# Patient Record
Sex: Female | Born: 1985 | Race: Asian | Hispanic: No | Marital: Single | Smoking: Never smoker
Health system: Southern US, Community
[De-identification: ages and names within clinical notes are randomized; demographics above are authoritative.]

## PROBLEM LIST (undated history)

## (undated) ENCOUNTER — Inpatient Hospital Stay (HOSPITAL_COMMUNITY): Payer: Self-pay

## (undated) DIAGNOSIS — O24419 Gestational diabetes mellitus in pregnancy, unspecified control: Secondary | ICD-10-CM

## (undated) DIAGNOSIS — D649 Anemia, unspecified: Secondary | ICD-10-CM

## (undated) HISTORY — DX: Gestational diabetes mellitus in pregnancy, unspecified control: O24.419

## (undated) HISTORY — PX: NO PAST SURGERIES: SHX2092

---

## 2004-09-15 ENCOUNTER — Other Ambulatory Visit: Admission: RE | Admit: 2004-09-15 | Discharge: 2004-09-15 | Payer: Self-pay | Admitting: Obstetrics and Gynecology

## 2004-10-05 ENCOUNTER — Inpatient Hospital Stay (HOSPITAL_COMMUNITY): Admission: AD | Admit: 2004-10-05 | Discharge: 2004-10-05 | Payer: Self-pay | Admitting: Obstetrics and Gynecology

## 2004-12-05 ENCOUNTER — Ambulatory Visit (HOSPITAL_COMMUNITY): Admission: RE | Admit: 2004-12-05 | Discharge: 2004-12-05 | Payer: Self-pay | Admitting: Obstetrics and Gynecology

## 2005-03-16 ENCOUNTER — Inpatient Hospital Stay (HOSPITAL_COMMUNITY): Admission: AD | Admit: 2005-03-16 | Discharge: 2005-03-18 | Payer: Self-pay | Admitting: Obstetrics and Gynecology

## 2005-04-27 ENCOUNTER — Other Ambulatory Visit: Admission: RE | Admit: 2005-04-27 | Discharge: 2005-04-27 | Payer: Self-pay | Admitting: Obstetrics and Gynecology

## 2006-07-22 ENCOUNTER — Other Ambulatory Visit: Admission: RE | Admit: 2006-07-22 | Discharge: 2006-07-22 | Payer: Self-pay | Admitting: Obstetrics and Gynecology

## 2008-10-11 ENCOUNTER — Inpatient Hospital Stay (HOSPITAL_COMMUNITY): Admission: AD | Admit: 2008-10-11 | Discharge: 2008-10-11 | Payer: Self-pay | Admitting: Obstetrics & Gynecology

## 2008-11-21 ENCOUNTER — Ambulatory Visit: Payer: Self-pay | Admitting: Obstetrics and Gynecology

## 2008-11-21 ENCOUNTER — Encounter: Payer: Self-pay | Admitting: Obstetrics and Gynecology

## 2008-11-21 LAB — CONVERTED CEMR LAB: hCG, Beta Chain, Quant, S: <2 milliintl units/mL

## 2010-10-08 LAB — GC/CHLAMYDIA PROBE AMP, GENITAL
Chlamydia, DNA Probe: NEGATIVE
GC Probe Amp, Genital: NEGATIVE

## 2010-10-08 LAB — DIFFERENTIAL
Basophils Absolute: 0 10*3/uL (ref 0.0–0.1)
Eosinophils Relative: 4 % (ref 0–5)
Neutrophils Relative %: 48 % (ref 43–77)

## 2010-10-08 LAB — CBC
HCT: 31 % — ABNORMAL LOW (ref 36.0–46.0)
Hemoglobin: 10.6 g/dL — ABNORMAL LOW (ref 12.0–15.0)
MCHC: 34.1 g/dL (ref 30.0–36.0)
MCV: 78.1 fL (ref 78.0–100.0)
Platelets: 286 10*3/uL (ref 150–400)
RBC: 3.97 MIL/uL (ref 3.87–5.11)

## 2010-10-08 LAB — WET PREP, GENITAL

## 2010-11-11 NOTE — Group Therapy Note (Signed)
Sierra Gamble, Sierra Gamble                  ACCOUNT NO.:  [id number]   MEDICAL RECORD NO.:  [id number]          PATIENT TYPE:  WOC   LOCATION:  [facility]                   FACILITY:  [facility]   PHYSICIAN:  Argentina Donovan, MD        DATE OF BIRTH:  Aug 31, 1985   DATE OF SERVICE:  11/21/2008                                  CLINIC NOTE   The patient is a 25 year old gravida 2, para 1-0-1-1 who was seen in the  maternity admissions with a failed pregnancy and missed abortion on  October 11, 2008, was given 800 of Cytotec which she took 2 days later.  Bleeding increased, lasted for about 2 weeks and then has stopped.  She  has had none since then.  Came in.  Has not had a Pap smear since 2007,  had an abnormal one in 2006 but apparently followup was normal.  The  patient will have a quantitative beta done today and if it is not back  to normal then we will give her a followup call.  She has decided that  she would like to start on birth control pills.  We are going to order a  prescription for Sprintec since she has no insurance at this point.   EXAMINATION:  Her abdomen is soft, flat, nontender.  No masses or  organomegaly.  External genitalia is normal.  BUS within normal limits.  Vagina is  clean and well rugated.  The cervix is clean with a marked ectropion.  Pap smear was taken.  The uterus is anterior, of normal size, shape,  consistency.  The adnexa is normal.   IMPRESSION:  Normal gynecological examination with a mild ectropion.   PLAN:  1. Contraceptives initiation with Sprintec.  2. Followup depending on quantitative beta hCG.           ______________________________  Argentina Donovan, MD     PR/MEDQ  D:  11/21/2008  T:  11/21/2008  Job:  [id number]

## 2010-11-14 NOTE — Discharge Summary (Signed)
Sierra Gamble, Sierra Gamble                  ACCOUNT NO.:  [id number]   MEDICAL RECORD NO.:  [id number]          PATIENT TYPE:  INP   LOCATION:  9135                          [facility]   PHYSICIAN:  James A. Ashley Royalty, M.D.DATE OF BIRTH:  1985/07/07   DATE OF ADMISSION:  03/16/2005  DATE OF DISCHARGE:  03/18/2005                                 DISCHARGE SUMMARY   DISCHARGE DIAGNOSES:  1.  Intrauterine pregnancy at 37-1/2 weeks' gestation, delivered.  2.  Term birth, living child, vertex.   __________ delivery.   CONSULTATIONS:  None.   DISCHARGE MEDICATIONS:  Tylenol.   HISTORY AND PHYSICAL:  This is a 25 year old gravida 2, para 0, EDC April 03, 2005, 37-1/2 weeks' gestation.  Prenatal care was essentially  uncomplicated.  The patient presented to the office on the day of admission  complaining of cramping and spotting.  Examination revealed her to be  completely dilated at 0 station.  She was transferred immediately to labor  and delivery.  For the remainder of the history and physical, please see  chart.   HOSPITAL COURSE:  The patient was admitted to [facility].  Admission laboratory studies were drawn.  The patient shortly  after delivered in the labor and delivery area at [facility].  She had  a spontaneous vaginal delivery just as I was called to come for delivery.  After delivery I inspected the patient's perineum and found it to be without  laceration.  There were no complications.  The infant was a 5 pound 5 ounce  female, Apgars 9 at one minute, 9 at five minutes, sent to the newborn  nursery.  The patient's postpartum course was benign.  She was discharged on  the second postpartum day, afebrile, in a satisfactory condition.   DISPOSITION:  The patient is to return to [facility] in four to six weeks for postpartum evaluation.           ______________________________  Rudy Jew Ashley Royalty, M.D.     JAM/MEDQ  D:   04/21/2005  T:  04/21/2005  Job:  [id number]

## 2013-10-28 ENCOUNTER — Ambulatory Visit: Payer: Self-pay | Admitting: Internal Medicine

## 2013-10-28 VITALS — BP 100/76 | HR 89 | Temp 98.7°F | Resp 14 | Ht 60.0 in | Wt 104.0 lb

## 2013-10-28 DIAGNOSIS — R059 Cough, unspecified: Secondary | ICD-10-CM

## 2013-10-28 DIAGNOSIS — J40 Bronchitis, not specified as acute or chronic: Secondary | ICD-10-CM

## 2013-10-28 DIAGNOSIS — J029 Acute pharyngitis, unspecified: Secondary | ICD-10-CM

## 2013-10-28 DIAGNOSIS — R05 Cough: Secondary | ICD-10-CM

## 2013-10-28 MED ORDER — HYDROCODONE-ACETAMINOPHEN 7.5-325 MG/15ML PO SOLN: 5.0000 mL | Freq: Four times a day (QID) | ORAL | Status: DC | PRN

## 2013-10-28 MED ORDER — AZITHROMYCIN 500 MG PO TABS: 500.0000 mg | ORAL_TABLET | Freq: Every day | ORAL | Status: DC

## 2013-10-28 NOTE — Patient Instructions (Signed)

## 2013-10-28 NOTE — Progress Notes (Signed)
   Subjective:    Patient ID: Sierra Gamble, female    DOB: 12-02-1985, 28 y.o.   MRN: [id number]  HPI 28 year old female pt presents with sinus congestion, chest congestion, fever, and cough. The sinus congestion started Sunday. Wednesday morning she was running a fever. Chest congestion started Friday. The fever ran between Wednesday and Thursday. Her fever ranged between 101.0-103.0. She also has a sore throat and headache. No SOB or chest pain. She has not been blowing anything out of her nose. Her cough is non-productive. She started taking advil, but now it just using tylenol. Her mom has been sick with similar symptoms.       Review of Systems     Objective:   Physical Exam  Constitutional: She is oriented to person, place, and time. She appears well-developed and well-nourished.  HENT:  Head: Normocephalic.  Right Ear: External ear normal.  Left Ear: External ear normal.  Nose: Mucosal edema, rhinorrhea and sinus tenderness present. Right sinus exhibits maxillary sinus tenderness. Right sinus exhibits no frontal sinus tenderness. Left sinus exhibits maxillary sinus tenderness. Left sinus exhibits no frontal sinus tenderness.  Mouth/Throat: Oropharynx is clear and moist.  Eyes: EOM are normal. Pupils are equal, round, and reactive to light.  Neck: Normal range of motion. Neck supple.  Cardiovascular: Normal rate.   Pulmonary/Chest: Effort normal. No respiratory distress. She has no wheezes. She has rhonchi. She has no rales. She exhibits no tenderness.  Musculoskeletal: Normal range of motion.  Neurological: She is alert and oriented to person, place, and time. She exhibits normal muscle tone. Coordination normal.  Psychiatric: She has a normal mood and affect. Her behavior is normal. Judgment and thought content normal.          Assessment & Plan:  Bronchitis/Sinusitis/Cough Zithromax 500mg Alvina Filbert elixir

## 2014-10-26 ENCOUNTER — Emergency Department (HOSPITAL_COMMUNITY)
Admission: EM | Admit: 2014-10-26 | Discharge: 2014-10-26 | Disposition: A | Discharge status: Home / Self Care | Payer: BLUE CROSS/BLUE SHIELD | Source: Home / Self Care | Attending: Family Medicine | Admitting: Family Medicine

## 2014-10-26 ENCOUNTER — Emergency Department (INDEPENDENT_AMBULATORY_CARE_PROVIDER_SITE_OTHER): Payer: BLUE CROSS/BLUE SHIELD

## 2014-10-26 ENCOUNTER — Encounter (HOSPITAL_COMMUNITY): Payer: Self-pay | Admitting: Emergency Medicine

## 2014-10-26 DIAGNOSIS — R0789 Other chest pain: Secondary | ICD-10-CM

## 2014-10-26 NOTE — ED Notes (Signed)
Patient c/o right side chest pain x 3 days. Patient reports that this is an old problem but it only recently became painful enough for her to seek medical attention. Patient denies cardiac problems. Patient reports chest feels achy and tight. Patient denies SOB. In NAD.

## 2014-10-26 NOTE — Discharge Instructions (Signed)
If your chest pain worsens or you develop shortness of breath, sweating, nausea, vomiting, dizziness, or any other symptoms that are concerning to you, call 911 or have someone take the emergency department. Otherwise follow-up with the cardiologist listed above, call their office Monday morning to arrange an appointment to be evaluated.  Chest Pain (Nonspecific) It is often hard to give a specific diagnosis for the cause of chest pain. There is always a chance that your pain could be related to something serious, such as a heart attack or a blood clot in the lungs. You need to follow up with your health care provider for further evaluation. CAUSES   Heartburn.  Pneumonia or bronchitis.  Anxiety or stress.  Inflammation around your heart (pericarditis) or lung (pleuritis or pleurisy).  A blood clot in the lung.  A collapsed lung (pneumothorax). It can develop suddenly on its own (spontaneous pneumothorax) or from trauma to the chest.  Shingles infection (herpes zoster virus). The chest wall is composed of bones, muscles, and cartilage. Any of these can be the source of the pain.  The bones can be bruised by injury.  The muscles or cartilage can be strained by coughing or overwork.  The cartilage can be affected by inflammation and become sore (costochondritis). DIAGNOSIS  Lab tests or other studies may be needed to find the cause of your pain. Your health care provider may have you take a test called an ambulatory electrocardiogram (ECG). An ECG records your heartbeat patterns over a 24-hour period. You may also have other tests, such as:  Transthoracic echocardiogram (TTE). During echocardiography, sound waves are used to evaluate how blood flows through your heart.  Transesophageal echocardiogram (TEE).  Cardiac monitoring. This allows your health care provider to monitor your heart rate and rhythm in real time.  Holter monitor. This is a portable device that records your  heartbeat and can help diagnose heart arrhythmias. It allows your health care provider to track your heart activity for several days, if needed.  Stress tests by exercise or by giving medicine that makes the heart beat faster. TREATMENT   Treatment depends on what may be causing your chest pain. Treatment may include:  Acid blockers for heartburn.  Anti-inflammatory medicine.  Pain medicine for inflammatory conditions.  Antibiotics if an infection is present.  You may be advised to change lifestyle habits. This includes stopping smoking and avoiding alcohol, caffeine, and chocolate.  You may be advised to keep your head raised (elevated) when sleeping. This reduces the chance of acid going backward from your stomach into your esophagus. Most of the time, nonspecific chest pain will improve within 2-3 days with rest and mild pain medicine.  HOME CARE INSTRUCTIONS   If antibiotics were prescribed, take them as directed. Finish them even if you start to feel better.  For the next few days, avoid physical activities that bring on chest pain. Continue physical activities as directed.  Do not use any tobacco products, including cigarettes, chewing tobacco, or electronic cigarettes.  Avoid drinking alcohol.  Only take medicine as directed by your health care provider.  Follow your health care provider's suggestions for further testing if your chest pain does not go away.  Keep any follow-up appointments you made. If you do not go to an appointment, you could develop lasting (chronic) problems with pain. If there is any problem keeping an appointment, call to reschedule. SEEK MEDICAL CARE IF:   Your chest pain does not go away, even after treatment.  You have a rash with blisters on your chest.  You have a fever. SEEK IMMEDIATE MEDICAL CARE IF:   You have increased chest pain or pain that spreads to your arm, neck, jaw, back, or abdomen.  You have shortness of breath.  You have  an increasing cough, or you cough up blood.  You have severe back or abdominal pain.  You feel nauseous or vomit.  You have severe weakness.  You faint.  You have chills. This is an emergency. Do not wait to see if the pain will go away. Get medical help at once. Call your local emergency services (911 in [place] Do not drive yourself to the hospital. MAKE SURE YOU:   Understand these instructions.  Will watch your condition.  Will get help right away if you are not doing well or get worse. Document Released: 03/25/2005 Document Revised: 06/20/2013 Document Reviewed: 01/19/2008 [facility] Patient Information 2015 [place], [place]. This information is not intended to replace advice given to you by your health care provider. Make sure you discuss any questions you have with your health care provider.

## 2014-10-26 NOTE — ED Provider Notes (Signed)
CSN: [id number]     Arrival date & time 10/26/14  1652 History   First MD Initiated Contact with Patient 10/26/14 1747     Chief Complaint  Patient presents with  . Chest Pain   (Consider location/radiation/quality/duration/timing/severity/associated sxs/prior Treatment) HPI      29 year old female presents complaining of chest pain. She has had this on and off for many years. She will have recurring right-sided chest pain that feels like squeezing. There are no associated symptoms including no shortness of breath, diaphoresis, NVD, dizziness, weakness. Pain is nonexertional and is not alleviated or exacerbated by anything. She denies any trauma. She denies any coughing or respiratory symptoms. She denies any past medical history. She does not take hormonal birth control and has no recent travel. She has never left the country. She has never seen anyone about this problem in the past. She has no relevant family history. She denies any pain in the breast  History reviewed. No pertinent past medical history. History reviewed. No pertinent past surgical history. Family History  Problem Relation Age of Onset  . Thyroid disease Mother   . Diabetes Maternal Grandmother   . Thyroid disease Maternal Grandmother    History  Substance Use Topics  . Smoking status: Never Smoker   . Smokeless tobacco: Never Used  . Alcohol Use: No   OB History    No data available     Review of Systems  Cardiovascular: Positive for chest pain.  All other systems reviewed and are negative.   Allergies  Review of patient's allergies indicates no known allergies.  Home Medications   Prior to Admission medications   Medication Sig Start Date End Date Taking? Authorizing Provider  azithromycin (ZITHROMAX) 500 MG tablet Take 1 tablet (500 mg total) by mouth daily. 10/28/13   Orma Flaming, MD  HYDROcodone-acetaminophen (HYCET) 7.5-325 mg/15 ml solution Take 5 mLs by mouth every 6 (six) hours as needed (or  cough). 10/28/13   Orma Flaming, MD   BP 120/81 mmHg  Pulse 69  Temp(Src) 98 F (36.7 C) (Oral)  Resp 18  SpO2 100%  LMP 10/26/2014 Physical Exam  Constitutional: She is oriented to person, place, and time. Vital signs are normal. She appears well-developed and well-nourished. No distress.  HENT:  Head: Normocephalic and atraumatic.  Right Ear: External ear normal.  Left Ear: External ear normal.  Nose: Nose normal.  Mouth/Throat: Oropharynx is clear and moist. No oropharyngeal exudate.  Eyes: Conjunctivae are normal.  Neck: Neck supple. No JVD present. No tracheal deviation present.  Cardiovascular: Normal rate, regular rhythm, normal heart sounds and intact distal pulses.  Exam reveals no gallop and no friction rub.   No murmur heard. Pulmonary/Chest: Effort normal and breath sounds normal. No respiratory distress. She has no wheezes. She has no rales. She exhibits no tenderness.  Abdominal: Soft. Bowel sounds are normal. She exhibits no distension and no mass. There is no tenderness. There is no rebound and no guarding.  Neurological: She is alert and oriented to person, place, and time. She has normal strength. Coordination normal.  Skin: Skin is warm and dry. No rash noted. She is not diaphoretic.  Psychiatric: She has a normal mood and affect. Judgment normal.  Nursing note and vitals reviewed.   ED Course  ED EKG  Date/Time: 10/26/2014 7:22 PM Performed by: Liam Graham Authorized by: Allena Katz H Comparison: not compared with previous ECG  Rhythm: sinus rhythm Rate: normal QRS axis: normal Conduction: conduction  normal ST Segments: ST segments normal T Waves: T waves normal T depression: V2 Other: no other findings Clinical impression: normal ECG Comments: EKG independently reviewed by me as above   (including critical care time) Labs Review Labs Reviewed - No data to display  Imaging Review Dg Chest 2 View  10/26/2014   CLINICAL DATA:  Chest pain  and tightness for several days.  EXAM: CHEST  2 VIEW  COMPARISON:  None.  FINDINGS: The heart size and mediastinal contours are within normal limits. Both lungs are clear. No evidence of pleural effusion. No mass or lymphadenopathy identified. The visualized skeletal structures are unremarkable.  IMPRESSION: Negative.  No active cardiopulmonary disease.   Electronically Signed   By: Earle Gell M.D.   On: 10/26/2014 19:16     MDM   1. Atypical chest pain    No red flags. Her exam is normal and her chest pain is atypical for any cardiac related chest pain or ischemic chest pain. Chest x-ray and EKG are normal. Will discharge with follow-up with cardiology next week. ED if worsening       Liam Graham, PA-C 10/26/14 1924

## 2015-05-16 ENCOUNTER — Other Ambulatory Visit: Payer: Self-pay | Admitting: Nurse Practitioner

## 2015-05-16 DIAGNOSIS — N6311 Unspecified lump in the right breast, upper outer quadrant: Secondary | ICD-10-CM

## 2015-05-20 ENCOUNTER — Ambulatory Visit
Admission: RE | Admit: 2015-05-20 | Discharge: 2015-05-20 | Disposition: A | Discharge status: Home / Self Care | Payer: BLUE CROSS/BLUE SHIELD | Source: Ambulatory Visit | Attending: Nurse Practitioner | Admitting: Nurse Practitioner

## 2015-05-20 DIAGNOSIS — N6311 Unspecified lump in the right breast, upper outer quadrant: Secondary | ICD-10-CM

## 2017-06-12 ENCOUNTER — Encounter (HOSPITAL_COMMUNITY): Payer: Self-pay | Admitting: *Deleted

## 2017-06-12 ENCOUNTER — Inpatient Hospital Stay (HOSPITAL_COMMUNITY)
Admission: AD | Admit: 2017-06-12 | Discharge: 2017-06-12 | Disposition: A | Discharge status: Home / Self Care | Payer: Medicaid Other | Source: Ambulatory Visit | Attending: Obstetrics and Gynecology | Admitting: Obstetrics and Gynecology

## 2017-06-12 ENCOUNTER — Inpatient Hospital Stay (HOSPITAL_COMMUNITY): Payer: Medicaid Other

## 2017-06-12 DIAGNOSIS — Z3A12 12 weeks gestation of pregnancy: Secondary | ICD-10-CM | POA: Insufficient documentation

## 2017-06-12 DIAGNOSIS — O209 Hemorrhage in early pregnancy, unspecified: Secondary | ICD-10-CM

## 2017-06-12 LAB — URINALYSIS, ROUTINE W REFLEX MICROSCOPIC
Bilirubin Urine: NEGATIVE
GLUCOSE, UA: NEGATIVE mg/dL
Ketones, ur: NEGATIVE mg/dL
Leukocytes, UA: NEGATIVE
Nitrite: NEGATIVE
PH: 7 (ref 5.0–8.0)
Protein, ur: NEGATIVE mg/dL
RBC / HPF: NONE SEEN RBC/hpf (ref 0–5)
SPECIFIC GRAVITY, URINE: 1.005 (ref 1.005–1.030)

## 2017-06-12 LAB — POCT PREGNANCY, URINE: Preg Test, Ur: POSITIVE — AB

## 2017-06-12 NOTE — MAU Provider Note (Signed)
History   G2P1001 @ 12 wks by LMP in with sm amt vag bleeding after having sex last night.   CSN: [id number]  Arrival date & time 06/12/17  1841   None     Chief Complaint  Patient presents with  . Possible Pregnancy  . Vaginal Bleeding    HPI  No past medical history on file.  No past surgical history on file.  Family History  Problem Relation Age of Onset  . Thyroid disease Mother   . Diabetes Maternal Grandmother   . Thyroid disease Maternal Grandmother     Social History   Tobacco Use  . Smoking status: Never Smoker  . Smokeless tobacco: Never Used  Substance Use Topics  . Alcohol use: No  . Drug use: No    OB History    Gravida Para Term Preterm AB Living   2 1 1     1    SAB TAB Ectopic Multiple Live Births           1      Review of Systems  Constitutional: Negative.   HENT: Negative.   Eyes: Negative.   Respiratory: Negative.   Cardiovascular: Negative.   Gastrointestinal: Negative.   Endocrine: Negative.   Genitourinary: Positive for vaginal bleeding.  Musculoskeletal: Negative.   Skin: Negative.   Allergic/Immunologic: Negative.   Neurological: Negative.   Hematological: Negative.   Psychiatric/Behavioral: Negative.     Allergies  Patient has no known allergies.  Home Medications    BP 114/76 (BP Location: Right Arm)   Pulse 61   Temp 98.6 F (37 C) (Oral)   Resp 16   Ht 5' (1.524 m)   Wt 119 lb (54 kg)   LMP 03/13/2017   SpO2 100%   BMI 23.24 kg/m   Physical Exam  Constitutional: She is oriented to person, place, and time. She appears well-developed and well-nourished.  HENT:  Head: Normocephalic.  Eyes: Pupils are equal, round, and reactive to light.  Neck: Normal range of motion.  Cardiovascular: Normal rate, regular rhythm, normal heart sounds and intact distal pulses.  Pulmonary/Chest: Effort normal and breath sounds normal.  Abdominal: Soft. Bowel sounds are normal.  Genitourinary: Vagina normal and uterus  normal.  Genitourinary Comments: Scant amt pinkish vag bleeding  Musculoskeletal: Normal range of motion.  Neurological: She is alert and oriented to person, place, and time. She has normal reflexes.  Skin: Skin is warm and dry.  Psychiatric: She has a normal mood and affect. Her behavior is normal. Judgment and thought content normal.    MAU Course  Procedures (including critical care time)  Labs Reviewed  URINALYSIS, ROUTINE W REFLEX MICROSCOPIC - Abnormal; Notable for the following components:      Result Value   APPearance HAZY (*)    Hgb urine dipstick SMALL (*)    Bacteria, UA RARE (*)    Squamous Epithelial / LPF 0-5 (*)    All other components within normal limits  POCT PREGNANCY, URINE - Abnormal; Notable for the following components:   Preg Test, Ur POSITIVE (*)    All other components within normal limits   No results found.   No diagnosis found.    MDM  VSS, scant amt vag bleeding noted. U/s shows viable preg at 6.5 wks will d/c pt home on pelvic rest

## 2017-06-12 NOTE — Discharge Instructions (Signed)
Vaginal Bleeding During Pregnancy, First Trimester A small amount of bleeding (spotting) from the vagina is common in early pregnancy. Sometimes the bleeding is normal and is not a problem, and sometimes it is a sign of something serious. Be sure to tell your doctor about any bleeding from your vagina right away. Follow these instructions at home:  Watch your condition for any changes.  Follow your doctor's instructions about how active you can be.  If you are on bed rest: ? You may need to stay in bed and only get up to use the bathroom. ? You may be allowed to do some activities. ? If you need help, make plans for someone to help you.  Write down: ? The number of pads you use each day. ? How often you change pads. ? How soaked (saturated) your pads are.  Do not use tampons.  Do not douche.  Do not have sex or orgasms until your doctor says it is okay.  If you pass any tissue from your vagina, save the tissue so you can show it to your doctor.  Only take medicines as told by your doctor.  Do not take aspirin because it can make you bleed.  Keep all follow-up visits as told by your doctor. Contact a doctor if:  You bleed from your vagina.  You have cramps.  You have labor pains.  You have a fever that does not go away after you take medicine. Get help right away if:  You have very bad cramps in your back or belly (abdomen).  You pass large clots or tissue from your vagina.  You bleed more.  You feel light-headed or weak.  You pass out (faint).  You have chills.  You are leaking fluid or have a gush of fluid from your vagina.  You pass out while pooping (having a bowel movement). This information is not intended to replace advice given to you by your health care provider. Make sure you discuss any questions you have with your health care provider. Document Released: 10/30/2013 Document Revised: 11/21/2015 Document Reviewed: 02/20/2013 Elsevier Interactive  Patient Education  2018 [place].  Pelvic Rest Pelvic rest may be recommended if:  Your placenta is partially or completely covering the opening of your cervix (placenta previa).  There is bleeding between the wall of the uterus and the amniotic sac in the first trimester of pregnancy (subchorionic hemorrhage).  You went into labor too early (preterm labor).  Based on your overall health and the health of your baby, your health care provider will decide if pelvic rest is right for you. How do I rest my pelvis? For as long as told by your health care provider:  Do not have sex, sexual stimulation, or an orgasm.  Do not use tampons. Do not douche. Do not put anything in your vagina.  Do not lift anything that is heavier than 10 lb (4.5 kg).  Avoid activities that take a lot of effort (are strenuous).  Avoid any activity in which your pelvic muscles could become strained.  When should I seek medical care? Seek medical care if you have:  Cramping pain in your lower abdomen.  Vaginal discharge.  A low, dull backache.  Regular contractions.  Uterine tightening.  When should I seek immediate medical care? Seek immediate medical care if:  You have vaginal bleeding and you are pregnant.  This information is not intended to replace advice given to you by your health care provider. Make sure  you discuss any questions you have with your health care provider. Document Released: 10/10/2010 Document Revised: 11/21/2015 Document Reviewed: 12/17/2014 Elsevier Interactive Patient Education  [employer].  .

## 2017-06-12 NOTE — MAU Note (Signed)
Pt reports spotting since this am, now bright red. Denies pain. Positive preg test at [facility]

## 2017-06-29 NOTE — L&D Delivery Note (Addendum)
Delivery Note At 6:12 PM a viable female was delivered via Vaginal, Spontaneous (Presentation: direct OA ).  APGAR: 9,9; weight pending  .   Placenta status: intact , sent to L&D .  Cord: three vessels with the following complications: None .  Cord pH: N/A  Anesthesia:  IV analgesia  Episiotomy:  None Lacerations: 1st degree, hemostatic, not repaired Suture Repair: N/A Est. Blood Loss (mL): 200  Infant very vigorous immediately after delivery. Cord clmaped and cut by FOB after 1 minute. PEr Dr. Barbaraann Rondo, infant placed skin-to-skin for immediate transition. Mom to postpartum.  Baby to decision pending based on infant weight.  Darlina Rumpf, CNM 12/30/2017, 6:21 PM

## 2017-07-05 ENCOUNTER — Other Ambulatory Visit (HOSPITAL_COMMUNITY): Payer: Self-pay | Admitting: Nurse Practitioner

## 2017-07-05 DIAGNOSIS — Z3A13 13 weeks gestation of pregnancy: Secondary | ICD-10-CM

## 2017-07-05 DIAGNOSIS — Z369 Encounter for antenatal screening, unspecified: Secondary | ICD-10-CM

## 2017-07-05 LAB — OB RESULTS CONSOLE GC/CHLAMYDIA
CHLAMYDIA, DNA PROBE: NEGATIVE
Gonorrhea: NEGATIVE

## 2017-07-05 LAB — OB RESULTS CONSOLE RUBELLA ANTIBODY, IGM: Rubella: IMMUNE

## 2017-07-05 LAB — OB RESULTS CONSOLE ABO/RH: "RH Type ": POSITIVE

## 2017-07-05 LAB — OB RESULTS CONSOLE HIV ANTIBODY (ROUTINE TESTING): HIV: NONREACTIVE

## 2017-07-05 LAB — OB RESULTS CONSOLE HEPATITIS B SURFACE ANTIGEN: Hepatitis B Surface Ag: NEGATIVE

## 2017-07-05 LAB — OB RESULTS CONSOLE RPR: RPR: NONREACTIVE

## 2017-07-21 ENCOUNTER — Encounter (HOSPITAL_COMMUNITY): Payer: Self-pay | Admitting: Nurse Practitioner

## 2017-07-27 ENCOUNTER — Ambulatory Visit (HOSPITAL_COMMUNITY)
Admission: RE | Admit: 2017-07-27 | Discharge: 2017-07-27 | Disposition: A | Discharge status: Home / Self Care | Payer: Medicaid Other | Source: Ambulatory Visit | Attending: Nurse Practitioner | Admitting: Nurse Practitioner

## 2017-07-27 ENCOUNTER — Encounter (HOSPITAL_COMMUNITY): Payer: Self-pay

## 2017-07-27 DIAGNOSIS — Z3682 Encounter for antenatal screening for nuchal translucency: Secondary | ICD-10-CM | POA: Diagnosis not present

## 2017-07-27 DIAGNOSIS — Z3A13 13 weeks gestation of pregnancy: Secondary | ICD-10-CM

## 2017-07-27 DIAGNOSIS — Z369 Encounter for antenatal screening, unspecified: Secondary | ICD-10-CM

## 2017-07-27 HISTORY — DX: Anemia, unspecified: D64.9

## 2017-08-06 ENCOUNTER — Other Ambulatory Visit (HOSPITAL_COMMUNITY): Payer: Self-pay

## 2017-08-11 ENCOUNTER — Inpatient Hospital Stay (HOSPITAL_COMMUNITY)
Admission: AD | Admit: 2017-08-11 | Discharge: 2017-08-12 | Disposition: A | Discharge status: Home / Self Care | Payer: Medicaid Other | Source: Ambulatory Visit | Attending: Obstetrics and Gynecology | Admitting: Obstetrics and Gynecology

## 2017-08-11 ENCOUNTER — Encounter (HOSPITAL_COMMUNITY): Payer: Self-pay

## 2017-08-11 DIAGNOSIS — R109 Unspecified abdominal pain: Secondary | ICD-10-CM | POA: Diagnosis not present

## 2017-08-11 DIAGNOSIS — O99512 Diseases of the respiratory system complicating pregnancy, second trimester: Secondary | ICD-10-CM | POA: Diagnosis not present

## 2017-08-11 DIAGNOSIS — J069 Acute upper respiratory infection, unspecified: Secondary | ICD-10-CM | POA: Diagnosis not present

## 2017-08-11 DIAGNOSIS — M545 Low back pain: Secondary | ICD-10-CM | POA: Diagnosis not present

## 2017-08-11 DIAGNOSIS — Z3A15 15 weeks gestation of pregnancy: Secondary | ICD-10-CM | POA: Diagnosis not present

## 2017-08-11 DIAGNOSIS — Z3492 Encounter for supervision of normal pregnancy, unspecified, second trimester: Secondary | ICD-10-CM

## 2017-08-11 DIAGNOSIS — O9989 Other specified diseases and conditions complicating pregnancy, childbirth and the puerperium: Secondary | ICD-10-CM | POA: Diagnosis not present

## 2017-08-11 DIAGNOSIS — Z833 Family history of diabetes mellitus: Secondary | ICD-10-CM | POA: Diagnosis not present

## 2017-08-11 DIAGNOSIS — Z8349 Family history of other endocrine, nutritional and metabolic diseases: Secondary | ICD-10-CM | POA: Insufficient documentation

## 2017-08-11 DIAGNOSIS — Z79899 Other long term (current) drug therapy: Secondary | ICD-10-CM | POA: Diagnosis not present

## 2017-08-11 DIAGNOSIS — O26892 Other specified pregnancy related conditions, second trimester: Secondary | ICD-10-CM | POA: Insufficient documentation

## 2017-08-11 DIAGNOSIS — Z79891 Long term (current) use of opiate analgesic: Secondary | ICD-10-CM | POA: Insufficient documentation

## 2017-08-11 LAB — INFLUENZA PANEL BY PCR (TYPE A & B)
INFLAPCR: NEGATIVE
Influenza B By PCR: NEGATIVE

## 2017-08-11 LAB — URINALYSIS, ROUTINE W REFLEX MICROSCOPIC
BILIRUBIN URINE: NEGATIVE
Glucose, UA: NEGATIVE mg/dL
Hgb urine dipstick: NEGATIVE
KETONES UR: NEGATIVE mg/dL
Leukocytes, UA: NEGATIVE
NITRITE: NEGATIVE
Protein, ur: NEGATIVE mg/dL
Specific Gravity, Urine: 1.009 (ref 1.005–1.030)
pH: 7 (ref 5.0–8.0)

## 2017-08-11 LAB — WET PREP, GENITAL
Clue Cells Wet Prep HPF POC: NONE SEEN
SPERM: NONE SEEN
Trich, Wet Prep: NONE SEEN
Yeast Wet Prep HPF POC: NONE SEEN

## 2017-08-11 MED ORDER — ACETAMINOPHEN 500 MG PO TABS
1000.0000 mg | ORAL_TABLET | Freq: Once | ORAL | Status: AC
  Administered 2017-08-11: 1000 mg via ORAL
  Filled 2017-08-11: qty 2

## 2017-08-11 NOTE — MAU Note (Signed)
Pt started with coughing, sore throat, nasal congestion and fever and chills for the past 4-5 days. Went to [facility] visit today and was checked for abnormal cervical cells. States she has been having abd cramping and low back cramping since the appt. 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

## 2017-08-11 NOTE — Discharge Instructions (Signed)
Safe Medications in Pregnancy   Acne: Benzoyl Peroxide Salicylic Acid  Backache/Headache: Tylenol: 2 regular strength every 4 hours OR              2 Extra strength every 6 hours  Colds/Coughs/Allergies: Benadryl (alcohol free) 25 mg every 6 hours as needed Breath right strips Claritin Cepacol throat lozenges Chloraseptic throat spray Cold-Eeze- up to three times per day Cough drops, alcohol free Flonase (by prescription only) Guaifenesin Mucinex Robitussin DM (plain only, alcohol free) Saline nasal spray/drops Sudafed (pseudoephedrine) & Actifed ** use only after [redacted] weeks gestation and if you do not have high blood pressure Tylenol Vicks Vaporub Zinc lozenges Zyrtec   Constipation: Colace Ducolax suppositories Fleet enema Glycerin suppositories Metamucil Milk of magnesia Miralax Senokot Smooth move tea  Diarrhea: Kaopectate Imodium A-D  *NO pepto Bismol  Hemorrhoids: Anusol Anusol HC Preparation H Tucks  Indigestion: Tums Maalox Mylanta Zantac  Pepcid  Insomnia: Benadryl (alcohol free) 25mg  every 6 hours as needed Tylenol PM Unisom, no Gelcaps  Leg Cramps: Tums MagGel  Nausea/Vomiting:  Bonine Dramamine Emetrol Ginger extract Sea bands Meclizine  Nausea medication to take during pregnancy:  Unisom (doxylamine succinate 25 mg tablets) Take one tablet daily at bedtime. If symptoms are not adequately controlled, the dose can be increased to a maximum recommended dose of two tablets daily (1/2 tablet in the morning, 1/2 tablet mid-afternoon and one at bedtime). Vitamin B6 100mg  tablets. Take one tablet twice a day (up to 200 mg per day).  Skin Rashes: Aveeno products Benadryl cream or 25mg  every 6 hours as needed Calamine Lotion 1% cortisone cream  Yeast infection: Gyne-lotrimin 7 Monistat 7  Gum/tooth pain: Anbesol  **If taking multiple medications, please check labels to avoid duplicating the same active ingredients **take  medication as directed on the label ** Do not exceed 4000 mg of tylenol in 24 hours **Do not take medications that contain aspirin or ibuprofen     Upper Respiratory Infection, Adult Most upper respiratory infections (URIs) are a viral infection of the air passages leading to the lungs. A URI affects the nose, throat, and upper air passages. The most common type of URI is nasopharyngitis and is typically referred to as "the common cold." URIs run their course and usually go away on their own. Most of the time, a URI does not require medical attention, but sometimes a bacterial infection in the upper airways can follow a viral infection. This is called a secondary infection. Sinus and middle ear infections are common types of secondary upper respiratory infections. Bacterial pneumonia can also complicate a URI. A URI can worsen asthma and chronic obstructive pulmonary disease (COPD). Sometimes, these complications can require emergency medical care and may be life threatening. What are the causes? Almost all URIs are caused by viruses. A virus is a type of germ and can spread from one person to another. What increases the risk? You may be at risk for a URI if:  You smoke.  You have chronic heart or lung disease.  You have a weakened defense (immune) system.  You are very young or very old.  You have nasal allergies or asthma.  You work in crowded or poorly ventilated areas.  You work in health care facilities or schools.  What are the signs or symptoms? Symptoms typically develop 2-3 days after you come in contact with a cold virus. Most viral URIs last 7-10 days. However, viral URIs from the influenza virus (flu virus) can last 14-18 days  and are typically more severe. Symptoms may include:  Runny or stuffy (congested) nose.  Sneezing.  Cough.  Sore throat.  Headache.  Fatigue.  Fever.  Loss of appetite.  Pain in your forehead, behind your eyes, and over your  cheekbones (sinus pain).  Muscle aches.  How is this diagnosed? Your health care provider may diagnose a URI by:  Physical exam.  Tests to check that your symptoms are not due to another condition such as: ? Strep throat. ? Sinusitis. ? Pneumonia. ? Asthma.  How is this treated? A URI goes away on its own with time. It cannot be cured with medicines, but medicines may be prescribed or recommended to relieve symptoms. Medicines may help:  Reduce your fever.  Reduce your cough.  Relieve nasal congestion.  Follow these instructions at home:  Take medicines only as directed by your health care provider.  Gargle warm saltwater or take cough drops to comfort your throat as directed by your health care provider.  Use a warm mist humidifier or inhale steam from a shower to increase air moisture. This may make it easier to breathe.  Drink enough fluid to keep your urine clear or pale yellow.  Eat soups and other clear broths and maintain good nutrition.  Rest as needed.  Return to work when your temperature has returned to normal or as your health care provider advises. You may need to stay home longer to avoid infecting others. You can also use a face mask and careful hand washing to prevent spread of the virus.  Increase the usage of your inhaler if you have asthma.  Do not use any tobacco products, including cigarettes, chewing tobacco, or electronic cigarettes. If you need help quitting, ask your health care provider. How is this prevented? The best way to protect yourself from getting a cold is to practice good hygiene.  Avoid oral or hand contact with people with cold symptoms.  Wash your hands often if contact occurs.  There is no clear evidence that vitamin C, vitamin E, echinacea, or exercise reduces the chance of developing a cold. However, it is always recommended to get plenty of rest, exercise, and practice good nutrition. Contact a health care provider  if:  You are getting worse rather than better.  Your symptoms are not controlled by medicine.  You have chills.  You have worsening shortness of breath.  You have brown or red mucus.  You have yellow or brown nasal discharge.  You have pain in your face, especially when you bend forward.  You have a fever.  You have swollen neck glands.  You have pain while swallowing.  You have white areas in the back of your throat. Get help right away if:  You have severe or persistent: ? Headache. ? Ear pain. ? Sinus pain. ? Chest pain.  You have chronic lung disease and any of the following: ? Wheezing. ? Prolonged cough. ? Coughing up blood. ? A change in your usual mucus.  You have a stiff neck.  You have changes in your: ? Vision. ? Hearing. ? Thinking. ? Mood. This information is not intended to replace advice given to you by your health care provider. Make sure you discuss any questions you have with your health care provider. Document Released: 12/09/2000 Document Revised: 02/16/2016 Document Reviewed: 09/20/2013 Elsevier Interactive Patient Education  [employer].

## 2017-08-11 NOTE — MAU Provider Note (Signed)
History     CSN: [id number]  Arrival date and time: 08/11/17 2016   First Provider Initiated Contact with Patient 08/11/17 2140      Chief Complaint  Patient presents with  . Headache  . Fever  . Vaginal Discharge   HPI Sierra Gamble is a 32 y.o. G3P1011 at [redacted]w[redacted]d who presents with vaginal discharge, abdominal cramping, headache, & cold symptoms.  Goes to [facility]; was there earlier today & had a colposcopy. Since then has had lower abdominal cramping and lower back pain. Rates pain 5/10. Has not treated pain. Denies dysuria or vaginal bleeding. Endorses vaginal discharge since her appointment that is thin & malodorous. No vaginal itching/irritation.  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 Symptoms include fever/chills, nonproductive cough, nasal congestion, headache, and runny nose. Had a temp of 103 yesterday. Denies sinus pain, ear pain, or sore throat. No flu contacts. Had flu vaccine this season. Has not treated symptoms. Rates headache 7/10. Has not taken anything for her headache.   OB History    Gravida Para Term Preterm AB Living   3 1 1   1 1    SAB TAB Ectopic Multiple Live Births   1       1      Past Medical History:  Diagnosis Date  . Anemia     Past Surgical History:  Procedure Laterality Date  . NO PAST SURGERIES      Family History  Problem Relation Age of Onset  . Thyroid disease Mother   . Diabetes Maternal Grandmother   . Thyroid disease Maternal Grandmother     Social History   Tobacco Use  . Smoking status: Never Smoker  . Smokeless tobacco: Never Used  Substance Use Topics  . Alcohol use: No  . Drug use: No    Allergies: No Known Allergies  Medications Prior to Admission  Medication Sig Dispense Refill Last Dose  . azithromycin (ZITHROMAX) 500 MG tablet Take 1 tablet (500 mg total) by mouth daily. (Patient not taking: Reported on 07/27/2017) 5 tablet 0 Not Taking  . HYDROcodone-acetaminophen (HYCET) 7.5-325 mg/15 ml  solution Take 5 mLs by mouth every 6 (six) hours as needed (or cough). (Patient not taking: Reported on 07/27/2017) 240 mL 0 Not Taking  . Prenatal Vit w/Fe-Methylfol-FA (PNV PO) Take by mouth.   Taking    Review of Systems  Constitutional: Positive for chills and fever.  HENT: Positive for congestion and rhinorrhea. Negative for ear pain, sinus pain and sore throat.   Respiratory: Positive for cough. Negative for shortness of breath.   Gastrointestinal: Positive for abdominal pain. Negative for nausea and vomiting.  Genitourinary: Positive for vaginal discharge. Negative for dysuria and vaginal bleeding.  Musculoskeletal: Positive for back pain.  Neurological: Positive for headaches.   Physical Exam   Blood pressure 104/68, pulse 97, temperature 99.1 F (37.3 C), temperature source Oral, resp. rate 17, height 5\' 1"  (1.549 m), weight 126 lb (57.2 kg), last menstrual period 03/13/2017.  Physical Exam  Nursing note and vitals reviewed. Constitutional: She is oriented to person, place, and time. She appears well-developed and well-nourished. No distress.  HENT:  Head: Normocephalic and atraumatic.  Right Ear: Tympanic membrane normal.  Left Ear: Tympanic membrane normal.  Nose: Rhinorrhea present.  Mouth/Throat: Oropharynx is clear and moist.  Eyes: Conjunctivae are normal. Right eye exhibits no discharge. Left eye exhibits no discharge. No scleral icterus.  Neck: Normal range of motion.  Cardiovascular: Normal  rate, regular rhythm and normal heart sounds.  No murmur heard. Respiratory: Effort normal and breath sounds normal. No respiratory distress. She has no wheezes.  GI: Soft. There is no tenderness.  Genitourinary: No bleeding in the vagina. Vaginal discharge (small amount of thin yellow discharge) found.  Genitourinary Comments: Cervix closed  Neurological: She is alert and oriented to person, place, and time.  Skin: Skin is warm and dry. She is not diaphoretic.  Psychiatric:  She has a normal mood and affect. Her behavior is normal. Judgment and thought content normal.    MAU Course  Procedures Results for orders placed or performed during the hospital encounter of 08/11/17 (from the past 24 hour(s))  Influenza panel by PCR (type A & B)     Status: None   Collection Time: 08/11/17  8:45 PM  Result Value Ref Range   Influenza A By PCR NEGATIVE NEGATIVE   Influenza B By PCR NEGATIVE NEGATIVE  Urinalysis, Routine w reflex microscopic     Status: None   Collection Time: 08/11/17  8:50 PM  Result Value Ref Range   Color, Urine YELLOW YELLOW   APPearance CLEAR CLEAR   Specific Gravity, Urine 1.009 1.005 - 1.030   pH 7.0 5.0 - 8.0   Glucose, UA NEGATIVE NEGATIVE mg/dL   Hgb urine dipstick NEGATIVE NEGATIVE   Bilirubin Urine NEGATIVE NEGATIVE   Ketones, ur NEGATIVE NEGATIVE mg/dL   Protein, ur NEGATIVE NEGATIVE mg/dL   Nitrite NEGATIVE NEGATIVE   Leukocytes, UA NEGATIVE NEGATIVE  Wet prep, genital     Status: Abnormal   Collection Time: 08/11/17  9:50 PM  Result Value Ref Range   Yeast Wet Prep HPF POC NONE SEEN NONE SEEN   Trich, Wet Prep NONE SEEN NONE SEEN   Clue Cells Wet Prep HPF POC NONE SEEN NONE SEEN   WBC, Wet Prep HPF POC MANY (A) NONE SEEN   Sperm NONE SEEN     MDM FHT 175 Flu swab negative Cervix closed U/a negative Wet prep collected Tylenol 1 gm PO  Assessment and Plan  A: 1. Upper respiratory tract infection, unspecified type   2. Fetal heart tones present, second trimester   3. 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

## 2017-12-30 ENCOUNTER — Other Ambulatory Visit: Payer: Self-pay

## 2017-12-30 ENCOUNTER — Inpatient Hospital Stay (HOSPITAL_COMMUNITY)
Admission: AD | Admit: 2017-12-30 | Discharge: 2018-01-01 | DRG: 805 | Disposition: A | Discharge status: Home / Self Care | Payer: Medicaid Other | Attending: Obstetrics and Gynecology | Admitting: Obstetrics and Gynecology

## 2017-12-30 ENCOUNTER — Encounter (HOSPITAL_COMMUNITY): Payer: Self-pay | Admitting: *Deleted

## 2017-12-30 DIAGNOSIS — O42013 Preterm premature rupture of membranes, onset of labor within 24 hours of rupture, third trimester: Secondary | ICD-10-CM | POA: Diagnosis not present

## 2017-12-30 DIAGNOSIS — Z3A35 35 weeks gestation of pregnancy: Secondary | ICD-10-CM

## 2017-12-30 DIAGNOSIS — Z283 Underimmunization status: Secondary | ICD-10-CM

## 2017-12-30 DIAGNOSIS — Z3483 Encounter for supervision of other normal pregnancy, third trimester: Secondary | ICD-10-CM | POA: Diagnosis present

## 2017-12-30 DIAGNOSIS — O99019 Anemia complicating pregnancy, unspecified trimester: Secondary | ICD-10-CM

## 2017-12-30 DIAGNOSIS — O09899 Supervision of other high risk pregnancies, unspecified trimester: Secondary | ICD-10-CM

## 2017-12-30 DIAGNOSIS — Z2839 Other underimmunization status: Secondary | ICD-10-CM

## 2017-12-30 DIAGNOSIS — O0990 Supervision of high risk pregnancy, unspecified, unspecified trimester: Secondary | ICD-10-CM

## 2017-12-30 DIAGNOSIS — D649 Anemia, unspecified: Secondary | ICD-10-CM | POA: Diagnosis present

## 2017-12-30 DIAGNOSIS — R87612 Low grade squamous intraepithelial lesion on cytologic smear of cervix (LGSIL): Secondary | ICD-10-CM

## 2017-12-30 DIAGNOSIS — Z3480 Encounter for supervision of other normal pregnancy, unspecified trimester: Secondary | ICD-10-CM

## 2017-12-30 DIAGNOSIS — O9902 Anemia complicating childbirth: Principal | ICD-10-CM | POA: Diagnosis present

## 2017-12-30 HISTORY — DX: Other underimmunization status: Z28.39

## 2017-12-30 HISTORY — DX: Supervision of other high risk pregnancies, unspecified trimester: O09.899

## 2017-12-30 LAB — CBC
HEMATOCRIT: 29.6 % — AB (ref 36.0–46.0)
HEMOGLOBIN: 9.9 g/dL — AB (ref 12.0–15.0)
MCH: 25.8 pg — ABNORMAL LOW (ref 26.0–34.0)
MCHC: 33.4 g/dL (ref 30.0–36.0)
MCV: 77.1 fL — ABNORMAL LOW (ref 78.0–100.0)
Platelets: 302 10*3/uL (ref 150–400)
RBC: 3.84 MIL/uL — AB (ref 3.87–5.11)
RDW: 15 % (ref 11.5–15.5)
WBC: 11.6 10*3/uL — ABNORMAL HIGH (ref 4.0–10.5)

## 2017-12-30 LAB — TYPE AND SCREEN
ABO/RH(D): O POS
Antibody Screen: NEGATIVE

## 2017-12-30 LAB — URINALYSIS, ROUTINE W REFLEX MICROSCOPIC
Bilirubin Urine: NEGATIVE
Glucose, UA: NEGATIVE mg/dL
Ketones, ur: NEGATIVE mg/dL
Nitrite: NEGATIVE
Protein, ur: NEGATIVE mg/dL
SPECIFIC GRAVITY, URINE: 1.006 (ref 1.005–1.030)
pH: 7 (ref 5.0–8.0)

## 2017-12-30 LAB — GROUP B STREP BY PCR: GROUP B STREP BY PCR: NEGATIVE

## 2017-12-30 LAB — POCT FERN TEST: POCT Fern Test: POSITIVE

## 2017-12-30 MED ORDER — PRENATAL MULTIVITAMIN CH
1.0000 | ORAL_TABLET | Freq: Every day | ORAL | Status: DC
  Administered 2017-12-31: 1 via ORAL
  Filled 2017-12-30: qty 1

## 2017-12-30 MED ORDER — OXYCODONE-ACETAMINOPHEN 5-325 MG PO TABS: 1.0000 | ORAL_TABLET | ORAL | Status: DC | PRN

## 2017-12-30 MED ORDER — BETAMETHASONE SOD PHOS & ACET 6 (3-3) MG/ML IJ SUSP
12.0000 mg | INTRAMUSCULAR | Status: DC
  Administered 2017-12-30: 12 mg via INTRAMUSCULAR
  Filled 2017-12-30 (×2): qty 2

## 2017-12-30 MED ORDER — OXYCODONE-ACETAMINOPHEN 5-325 MG PO TABS: 2.0000 | ORAL_TABLET | ORAL | Status: DC | PRN

## 2017-12-30 MED ORDER — FENTANYL CITRATE (PF) 100 MCG/2ML IJ SOLN
50.0000 ug | Freq: Once | INTRAMUSCULAR | Status: AC
  Administered 2017-12-30: 50 ug via INTRAVENOUS
  Filled 2017-12-30: qty 2

## 2017-12-30 MED ORDER — IBUPROFEN 600 MG PO TABS
600.0000 mg | ORAL_TABLET | Freq: Four times a day (QID) | ORAL | Status: DC
  Administered 2017-12-30 – 2018-01-01 (×7): 600 mg via ORAL
  Filled 2017-12-30 (×7): qty 1

## 2017-12-30 MED ORDER — LACTATED RINGERS IV SOLN: INTRAVENOUS | Status: DC

## 2017-12-30 MED ORDER — TETANUS-DIPHTH-ACELL PERTUSSIS 5-2.5-18.5 LF-MCG/0.5 IM SUSP: 0.5000 mL | Freq: Once | INTRAMUSCULAR | Status: DC

## 2017-12-30 MED ORDER — OXYTOCIN 40 UNITS IN LACTATED RINGERS INFUSION - SIMPLE MED
2.5000 [IU]/h | INTRAVENOUS | Status: DC
  Filled 2017-12-30: qty 1000

## 2017-12-30 MED ORDER — MAGNESIUM HYDROXIDE 400 MG/5ML PO SUSP: 30.0000 mL | ORAL | Status: DC | PRN

## 2017-12-30 MED ORDER — ACETAMINOPHEN 325 MG PO TABS: 650.0000 mg | ORAL_TABLET | ORAL | Status: DC | PRN

## 2017-12-30 MED ORDER — OXYTOCIN BOLUS FROM INFUSION
500.0000 mL | Freq: Once | INTRAVENOUS | Status: AC
  Administered 2017-12-30: 500 mL via INTRAVENOUS

## 2017-12-30 MED ORDER — LACTATED RINGERS IV BOLUS
1000.0000 mL | Freq: Once | INTRAVENOUS | Status: AC
  Administered 2017-12-30: 1000 mL via INTRAVENOUS

## 2017-12-30 MED ORDER — ONDANSETRON HCL 4 MG PO TABS: 4.0000 mg | ORAL_TABLET | ORAL | Status: DC | PRN

## 2017-12-30 MED ORDER — DIPHENHYDRAMINE HCL 25 MG PO CAPS: 25.0000 mg | ORAL_CAPSULE | Freq: Four times a day (QID) | ORAL | Status: DC | PRN

## 2017-12-30 MED ORDER — WITCH HAZEL-GLYCERIN EX PADS: 1.0000 "application " | MEDICATED_PAD | CUTANEOUS | Status: DC | PRN

## 2017-12-30 MED ORDER — ZOLPIDEM TARTRATE 5 MG PO TABS: 5.0000 mg | ORAL_TABLET | Freq: Every evening | ORAL | Status: DC | PRN

## 2017-12-30 MED ORDER — SIMETHICONE 80 MG PO CHEW: 80.0000 mg | CHEWABLE_TABLET | ORAL | Status: DC | PRN

## 2017-12-30 MED ORDER — PENICILLIN G POT IN DEXTROSE 60000 UNIT/ML IV SOLN
3.0000 10*6.[IU] | INTRAVENOUS | Status: DC
  Filled 2017-12-30 (×3): qty 50

## 2017-12-30 MED ORDER — BENZOCAINE-MENTHOL 20-0.5 % EX AERO: 1.0000 "application " | INHALATION_SPRAY | CUTANEOUS | Status: DC | PRN

## 2017-12-30 MED ORDER — COCONUT OIL OIL: 1.0000 "application " | TOPICAL_OIL | Status: DC | PRN

## 2017-12-30 MED ORDER — LACTATED RINGERS IV SOLN: 500.0000 mL | INTRAVENOUS | Status: DC | PRN

## 2017-12-30 MED ORDER — SODIUM CHLORIDE 0.9 % IV SOLN
5.0000 10*6.[IU] | Freq: Once | INTRAVENOUS | Status: AC
  Administered 2017-12-30: 5 10*6.[IU] via INTRAVENOUS
  Filled 2017-12-30: qty 5

## 2017-12-30 MED ORDER — FERROUS SULFATE 325 (65 FE) MG PO TABS
325.0000 mg | ORAL_TABLET | Freq: Two times a day (BID) | ORAL | Status: DC
  Administered 2017-12-31 – 2018-01-01 (×3): 325 mg via ORAL
  Filled 2017-12-30 (×3): qty 1

## 2017-12-30 MED ORDER — MEASLES, MUMPS & RUBELLA VAC ~~LOC~~ INJ
0.5000 mL | INJECTION | Freq: Once | SUBCUTANEOUS | Status: DC
  Filled 2017-12-30: qty 0.5

## 2017-12-30 MED ORDER — SOD CITRATE-CITRIC ACID 500-334 MG/5ML PO SOLN: 30.0000 mL | ORAL | Status: DC | PRN

## 2017-12-30 MED ORDER — ONDANSETRON HCL 4 MG/2ML IJ SOLN: 4.0000 mg | Freq: Four times a day (QID) | INTRAMUSCULAR | Status: DC | PRN

## 2017-12-30 MED ORDER — LIDOCAINE HCL (PF) 1 % IJ SOLN
30.0000 mL | INTRAMUSCULAR | Status: DC | PRN
  Filled 2017-12-30: qty 30

## 2017-12-30 MED ORDER — DIBUCAINE 1 % RE OINT: 1.0000 "application " | TOPICAL_OINTMENT | RECTAL | Status: DC | PRN

## 2017-12-30 MED ORDER — FLEET ENEMA 7-19 GM/118ML RE ENEM: 1.0000 | ENEMA | RECTAL | Status: DC | PRN

## 2017-12-30 MED ORDER — ONDANSETRON HCL 4 MG/2ML IJ SOLN: 4.0000 mg | INTRAMUSCULAR | Status: DC | PRN

## 2017-12-30 NOTE — MAU Note (Signed)
Pt reports pain in her lower abd since this am, and the thinks her mucous plug came out.

## 2017-12-30 NOTE — Anesthesia Pain Management Evaluation Note (Signed)
  CRNA Pain Management Visit Note  Patient: Sierra Gamble, 32 y.o., female  "Hello I am a member of the anesthesia team at [facility]. We have an anesthesia team available at all times to provide care throughout the hospital, including epidural management and anesthesia for C-section. I don't know your plan for the delivery whether it a natural birth, water birth, IV sedation, nitrous supplementation, doula or epidural, but we want to meet your pain goals."   1.Was your pain managed to your expectations on prior hospitalizations?   Yes   2.What is your expectation for pain management during this hospitalization?     Labor support without medications  3.How can we help you reach that goal? Standby for emergency.  Record the patient's initial score and the patient's pain goal.   Pain: 8  Pain Goal: 10 [facility] wants you to be able to say your pain was always managed very well.  Deng Kemler 12/30/2017

## 2017-12-30 NOTE — H&P (Addendum)
Sierra Gamble is a 32 y.o. female G3P1011 at [redacted]w[redacted]d by 5 week [place] presenting for SOL, discovered to be grossly ruptured. Patient reports history of "very fast" vaginal deliveries.  Denies vaginal bleeding, decreased fetal movement, fever, falls, or recent illness.  Receives prenatal care are [facility], denies complications or interruptions in pregnancy.  Expecting female infant, does not want circumcision, planning to breastfeed, undecided about contraception but considering Micronor.  Previous SVD of 5lb 5oz infant at [redacted] weeks GA.   OB History    Gravida  3   Para  1   Term  1   Preterm      AB  1   Living  1     SAB  1   TAB      Ectopic      Multiple      Live Births  1          Patient Active Problem List   Diagnosis Date Noted  . Anemia complicating pregnancy, unspecified trimester 12/30/2017  . Supervision of other normal pregnancy, antepartum 12/30/2017  . Preterm labor 12/30/2017  . Pap smear abnormality of cervix with LGSIL 12/30/2017  . Maternal varicella, non-immune 12/30/2017    Past Medical History:  Diagnosis Date  . Anemia    Past Surgical History:  Procedure Laterality Date  . NO PAST SURGERIES     Family History: family history includes Diabetes in her maternal grandmother; Thyroid disease in her maternal grandmother and mother. Social History:  reports that she has never smoked. She has never used smokeless tobacco. She reports that she does not drink alcohol or use drugs.     Maternal Diabetes: No Genetic Screening: Declined Maternal Ultrasounds/Referrals: Normal Fetal Ultrasounds or other Referrals:  None Maternal Substance Abuse:  No Significant Maternal Medications:  None Significant Maternal Lab Results:  None Other Comments:  None  Review of Systems  Gastrointestinal: Positive for abdominal pain.  Neurological: Negative for weakness and headaches.  All other systems reviewed and are negative.  History Dilation: 5.5 Effacement (%):  90 Station: -1 Exam by:: A. Gagliardo, RN Blood pressure 134/90, pulse 61, temperature 98.9 F (37.2 C), temperature source Oral, resp. rate 16, height 5\' 1"  (1.549 m), weight 160 lb (72.6 kg), last menstrual period 03/13/2017, SpO2 99 %. Exam Physical Exam  Nursing note and vitals reviewed. Constitutional: She is oriented to person, place, and time. She appears well-developed and well-nourished.  HENT:  Head: Normocephalic.  Cardiovascular: Normal rate, regular rhythm, normal heart sounds and intact distal pulses.  Respiratory: Effort normal and breath sounds normal.  Genitourinary: Vagina normal and uterus normal.  Musculoskeletal: Normal range of motion.  Neurological: She is alert and oriented to person, place, and time. She has normal reflexes.  Psychiatric: She has a normal mood and affect. Her behavior is normal. Judgment and thought content normal.    Prenatal labs: ABO, Rh:  O post Antibody:  Negative Rubella:  Pending RPR:   Nonreactive HBsAg:   Negative HIV:   Negative GBS:   Pending, PCN order placed  Assessment/Plan: --32 y.o. G3P1011 at [redacted]w[redacted]d  --Reactive NST: baseline 140, moderate variability, positive accels, no decels --Grossly ruptured 1:00pm today --Active labor 6cm --BMZ #1 of 2 given in MAU --GBS unknown, Rapid GBS collected, NKDA, treat with PCN --Boy/no circ/breast/unsure but considering Micronor --Admit to [facility] for expectant management  Darlina Rumpf, CNM 12/30/2017, 1:09 PM

## 2017-12-31 NOTE — Progress Notes (Addendum)
POSTPARTUM PROGRESS NOTE  Post Partum Day 1 Subjective:  Sierra Gamble is a 32 y.o. V6P0141 68w3ds/p SVD.  No acute events overnight.  Pt denies problems with ambulating, voiding or po intake.  She denies nausea or vomiting.  Pain is well controlled. Lochia Minimal.   Objective: Blood pressure 120/63, pulse (!) 52, temperature 97.8 F (36.6 C), temperature source Oral, resp. rate 18, height '5\' 1"'  (1.549 m), weight 160 lb (72.6 kg), last menstrual period 03/13/2017, SpO2 99 %, unknown if currently breastfeeding.  Physical Exam:  General: alert, cooperative and no distress Lochia:normal flow Chest: no respiratory distress Heart:regular rate, distal pulses intact Abdomen: soft, nontender,  Uterine Fundus: firm, appropriately tender DVT Evaluation: No calf swelling or tenderness Extremities: no edema  Recent Labs    12/30/17 1308  HGB 9.9*  HCT 29.6*    Assessment/Plan:  ASSESSMENT: Sierra Ehleris a 32y.o. [id number]/p SVD, doing well. Needs Varicella immunization postpartum. Breastfeeding, plans for POPs for contraception. Plan for discharge tomorrow.   LOS: 1 day   AlElmon Kirschner/10/2017, 6:07 AM  \ I have spoken with and examined this patient and agree with resident/PA-S/Med-S/SNM's note and plan of care. VSS, HRR&R, Resp unlabored, Legs neg.  FrNigel BertholdCNM 12/31/2017 8:11 AM

## 2017-12-31 NOTE — Lactation Note (Signed)
This note was copied from a baby's chart. Lactation Consultation Note  Patient Name: Boy Kamri Gotsch [id number] Date: 12/31/2017 Reason for consult: Initial assessment;Difficult latch;Infant < 6lbs;Late-preterm 34-36.6wks   Initial consult with Exp BF mom of 29 hour old LPT infant. Infant with 2 BF for 15-30 minutes, 2 BF attempts, formula feeds x 2 of 15 cc, and no output at this time. Infant weight 5 pounds 12.2 ounces with weight loss of 1% since birth. LATCH scores 7-8. Infant asleep in his crib.   Mom reports infant has latched ok. She reports he will not latch to the right breast as her 33 yo would not in the beginning either. She has only tried the football hold so far. Reviewed with mom trying different positions and to call for assistance with next feeding to see if we can assist her with getting infant latched. Discussed using 5 french feeding tube at the breast for supplementation also. Reviewed typical feeding behaviors of the LPT infant such as sleepiness at the breast.   Mom is pumping for 15 minutes with DEBP on Initiate setting after BF. Enc mom to offer the breast 8-12 x in 24 hours with feeding cues with no longer than 3 hours between feeds, awakening as needed. Mom to follow BF with EBM then formula per supplementation guidelines. Reviewed supplementation guidelines and increasing volumes as infant gets older. Parents voiced understanding. Enc mom to hand express post pumping, she reports she is able to hand express independently.   Reviewed LPT infant policy handout with parents. Advised parents to awaked infant to feed if he is not self awakening. Enc STS, keeping hat on at all times and limiting stimulation to infant between feeds.  BF Resources handout and [employer]ure given, mom informed of IP/OP services, BF Support Groups and [place] phone #. Mom is active with WIC, WIC pump referral faxed to [facility] office and notified [facility] reps in the hospital. Mom reprots she may be able to  get a Medela DEBP from a family member, discussed with mom that they are meant to be single user pumps and she should be very comfortable using someone else's pump. Recommeded that she use her own tubing. Mom voiced understanding.   Mom reports all questions/concerns have been answered. Mom to call out for feeding assistance as needed.        Maternal Data Formula Feeding for Exclusion: No Has patient been taught Hand Expression?: Yes Does the patient have breastfeeding experience prior to this delivery?: Yes  Feeding Feeding Type: Bottle Fed - Formula Length of feed: 15 min  LATCH Score                   Interventions    Lactation Tools Discussed/Used WIC Program: Yes Pump Review: Setup, frequency, and cleaning;Milk Storage Initiated by:: reviewed and encouraged 8-12 x in 24 hours post BF   Consult Status Consult Status: Follow-up Date: 01/01/18 Follow-up type: In-patient    Debby Freiberg Hice 12/31/2017, 9:51 AM

## 2018-01-01 LAB — RPR: RPR: NONREACTIVE

## 2018-01-01 MED ORDER — IBUPROFEN 600 MG PO TABS: 600.0000 mg | ORAL_TABLET | Freq: Four times a day (QID) | ORAL | 0 refills | Status: DC

## 2018-01-01 NOTE — Discharge Summary (Addendum)
OB Discharge Summary    Patient Name: Sierra Gamble DOB: 1986-05-12 MRN: [id number] Date of admission: 12/30/2017  Delivering MD: Darlina Rumpf   Date of discharge: 01/01/2018  Admitting diagnosis: SOL Intrauterine pregnancy: [redacted]w[redacted]d    Secondary diagnosis:  Active Problems:   Patient Active Problem List   Diagnosis Date Noted  . Anemia complicating pregnancy, unspecified trimester 12/30/2017  . Supervision of other normal pregnancy, antepartum 12/30/2017  . Preterm labor 12/30/2017  . Pap smear abnormality of cervix with LGSIL 12/30/2017  . Maternal varicella, non-immune 12/30/2017   Additional problems:  H/o preterm delivery     Discharge diagnosis: Preterm Pregnancy Delivered                                  Post partum procedures: none  Complications: None  Hospital course:  Onset of Labor With Vaginal Delivery     32 y.o. yo [id number] at [redacted]w[redacted]d was admitted in Active Labor on 12/30/2017. Patient had an uncomplicated labor course as follows:  Membrane Rupture Time/Date: 1:00 PM ,12/30/2017   Intrapartum Procedures: Episiotomy: None [1]                                         Lacerations:  1st degree [2]  Patient had a delivery of a Viable infant. 12/30/2017  Information for the patient's newborn:  Birdena, Kingma [[id number]]  Delivery Method: Vaginal, Spontaneous(Filed from Delivery Summary)  Pateint had an uncomplicated postpartum course.  She is ambulating, tolerating a regular diet, passing flatus, and urinating well. Patient is discharged home in stable condition on 01/01/18.  Physical exam  Vitals:   12/31/17 2309 01/01/18 0527  BP: (!) 95/54 109/62  Pulse: 64 (!) 58  Resp:    Temp:  98.4 F (36.9 C)  SpO2:  100%    General: alert, cooperative and no distress Lochia: appropriate Uterine Fundus: firm Incision: N/A DVT Evaluation: No evidence of DVT seen on physical exam.  Labs: No results found for this or any previous visit (from the past 24  hour(s)).  Discharge instruction: per After Visit Summary and "Baby and Me Booklet".  After visit meds:  No Known Allergies  Allergies as of 01/01/2018   No Known Allergies     Medication List    TAKE these medications   ibuprofen 600 MG tablet Commonly known as:  ADVIL,MOTRIN Take 1 tablet (600 mg total) by mouth every 6 (six) hours.   PNV PO Take 1 tablet by mouth daily.      Diet: routine diet  Activity: Advance as tolerated. Pelvic rest for 6 weeks.   Outpatient follow up: 4-6 weeks postpartum Future Appointments: No future appointments.  Follow up Appt: No follow-ups on file.  Postpartum contraception: Progesterone only pills  Newborn Data: APGAR (1 MIN): 9   APGAR (5 MINS): 9    Baby Feeding: Breast Disposition:home with mother  Rory Percy, DO 01/01/2018   Midwife attestation I have seen and examined this patient and agree with above documentation in the resident's note.   Brittny Spangle is a 32 y.o. [id number] s/p SVD.   Pain is well controlled.  Plan for birth control is oral progesterone-only contraceptive.  Method of Feeding: breast  PE:  Gen: well appearing Heart: reg rate Lungs: normal WOB Fundus firm Ext: soft, no pain, no edema  Recent Labs    12/30/17 1308  HGB 9.9*  HCT 29.6*     Assessment PPD #2 SVD  Plan: - discharge today - postpartum care discussed - f/u clinic in 6 weeks for postpartum visit   Julianne Handler, CNM 8:43 AM

## 2018-01-01 NOTE — Discharge Instructions (Signed)
Vaginal Delivery, Care After °Refer to this sheet in the next few weeks. These instructions provide you with information about caring for yourself after vaginal delivery. Your health care provider may also give you more specific instructions. Your treatment has been planned according to current medical practices, but problems sometimes occur. Call your health care provider if you have any problems or questions. °What can I expect after the procedure? °After vaginal delivery, it is common to have: °· Some bleeding from your vagina. °· Soreness in your abdomen, your vagina, and the area of skin between your vaginal opening and your anus (perineum). °· Pelvic cramps. °· Fatigue. ° °Follow these instructions at home: °Medicines °· Take over-the-counter and prescription medicines only as told by your health care provider. °· If you were prescribed an antibiotic medicine, take it as told by your health care provider. Do not stop taking the antibiotic until it is finished. °Driving ° °· Do not drive or operate heavy machinery while taking prescription pain medicine. °· Do not drive for 24 hours if you received a sedative. °Lifestyle °· Do not drink alcohol. This is especially important if you are breastfeeding or taking medicine to relieve pain. °· Do not use tobacco products, including cigarettes, chewing tobacco, or e-cigarettes. If you need help quitting, ask your health care provider. °Eating and drinking °· Drink at least 8 eight-ounce glasses of water every day unless you are told not to by your health care provider. If you choose to breastfeed your baby, you may need to drink more water than this. °· Eat high-fiber foods every day. These foods may help prevent or relieve constipation. High-fiber foods include: °? Whole grain cereals and breads. °? Brown rice. °? Beans. °? Fresh fruits and vegetables. °Activity °· Return to your normal activities as told by your health care provider. Ask your health care provider  what activities are safe for you. °· Rest as much as possible. Try to rest or take a nap when your baby is sleeping. °· Do not lift anything that is heavier than your baby or 10 lb (4.5 kg) until your health care provider says that it is safe. °· Talk with your health care provider about when you can engage in sexual activity. This may depend on your: °? Risk of infection. °? Rate of healing. °? Comfort and desire to engage in sexual activity. °Vaginal Care °· If you have an episiotomy or a vaginal tear, check the area every day for signs of infection. Check for: °? More redness, swelling, or pain. °? More fluid or blood. °? Warmth. °? Pus or a bad smell. °· Do not use tampons or douches until your health care provider says this is safe. °· Watch for any blood clots that may pass from your vagina. These may look like clumps of dark red, brown, or black discharge. °General instructions °· Keep your perineum clean and dry as told by your health care provider. °· Wear loose, comfortable clothing. °· Wipe from front to back when you use the toilet. °· Ask your health care provider if you can shower or take a bath. If you had an episiotomy or a perineal tear during labor and delivery, your health care provider may tell you not to take baths for a certain length of time. °· Wear a bra that supports your breasts and fits you well. °· If possible, have someone help you with household activities and help care for your baby for at least a few days after   you leave the hospital. °· Keep all follow-up visits for you and your baby as told by your health care provider. This is important. °Contact a health care provider if: °· You have: °? Vaginal discharge that has a bad smell. °? Difficulty urinating. °? Pain when urinating. °? A sudden increase or decrease in the frequency of your bowel movements. °? More redness, swelling, or pain around your episiotomy or vaginal tear. °? More fluid or blood coming from your episiotomy or  vaginal tear. °? Pus or a bad smell coming from your episiotomy or vaginal tear. °? A fever. °? A rash. °? Little or no interest in activities you used to enjoy. °? Questions about caring for yourself or your baby. °· Your episiotomy or vaginal tear feels warm to the touch. °· Your episiotomy or vaginal tear is separating or does not appear to be healing. °· Your breasts are painful, hard, or turn red. °· You feel unusually sad or worried. °· You feel nauseous or you vomit. °· You pass large blood clots from your vagina. If you pass a blood clot from your vagina, save it to show to your health care provider. Do not flush blood clots down the toilet without having your health care provider look at them. °· You urinate more than usual. °· You are dizzy or light-headed. °· You have not breastfed at all and you have not had a menstrual period for 12 weeks after delivery. °· You have stopped breastfeeding and you have not had a menstrual period for 12 weeks after you stopped breastfeeding. °Get help right away if: °· You have: °? Pain that does not go away or does not get better with medicine. °? Chest pain. °? Difficulty breathing. °? Blurred vision or spots in your vision. °? Thoughts about hurting yourself or your baby. °· You develop pain in your abdomen or in one of your legs. °· You develop a severe headache. °· You faint. °· You bleed from your vagina so much that you fill two sanitary pads in one hour. °This information is not intended to replace advice given to you by your health care provider. Make sure you discuss any questions you have with your health care provider. °Document Released: 06/12/2000 Document Revised: 11/27/2015 Document Reviewed: 06/30/2015 °Elsevier Interactive Patient Education © 2018 [employer] ° °

## 2018-06-29 NOTE — L&D Delivery Note (Signed)
Patient: Sierra Gamble MRN: [id number]  GBS status: Negative, IAP given: None   Patient is a 33 y.o. now G4P3 s/p NSVD at [redacted]w[redacted]d, who was admitted for SOL. AROM 0h 42m prior to delivery with thick meconium stained fluid.    Delivery Note  At 11:04 AM a viable female was delivered via Vaginal, Spontaneous (Presentation:   Occiput Anterior).  APGAR: 8, 9; weight 6 lb 11.8 oz (3056 g).   Placenta status: Spontaneous, Intact.  Cord: 3 vessels with the following complications: nucahl cord x1, reduced at perineum.   Anesthesia: None Episiotomy: None Lacerations: None Suture Repair: N/A Est. Blood Loss (mL): 300  Was called to room for imminent delivery. Upon arrival, fetal head was out with nuchal cord x1. Head delivered OA. Nuchal cord reduced at the perineum Shoulder and body delivered in usual fashion. Infant with spontaneous cry, placed on mother's abdomen, dried and bulb suctioned. Cord clamped x 2 after 1-minute delay, and cut by family member. Cord blood drawn. Placenta delivered spontaneously with gentle cord traction. Fundus firm with massage and Pitocin. Perineum inspected and found to have no lacerations.  Mom to postpartum.  Baby to Couplet care / Skin to Skin.  Melina Schools 06/17/2019, 1:00 PM

## 2018-11-22 ENCOUNTER — Telehealth: Payer: Self-pay | Admitting: Student

## 2018-11-22 NOTE — Telephone Encounter (Signed)
Called the patient to confirm upcoming appointment. Left a detailed voicemail message and how to access the mychart visit.

## 2018-11-23 ENCOUNTER — Telehealth (INDEPENDENT_AMBULATORY_CARE_PROVIDER_SITE_OTHER): Payer: Self-pay | Admitting: Obstetrics & Gynecology

## 2018-11-23 ENCOUNTER — Other Ambulatory Visit: Payer: Self-pay

## 2018-11-23 ENCOUNTER — Encounter: Payer: Self-pay | Admitting: Obstetrics & Gynecology

## 2018-11-23 DIAGNOSIS — Z3401 Encounter for supervision of normal first pregnancy, first trimester: Secondary | ICD-10-CM

## 2018-11-23 DIAGNOSIS — Z3480 Encounter for supervision of other normal pregnancy, unspecified trimester: Secondary | ICD-10-CM

## 2018-11-23 DIAGNOSIS — Z3A10 10 weeks gestation of pregnancy: Secondary | ICD-10-CM

## 2018-11-23 MED ORDER — AMBULATORY NON FORMULARY MEDICATION: 1.0000 | 0 refills | Status: DC

## 2018-11-23 NOTE — Progress Notes (Signed)
I connected with  Sierra Gamble on 11/23/18 at  1:35 PM EDT by telephone and verified that I am speaking with the correct person using two identifiers.   I discussed the limitations, risks, security and privacy concerns of performing an evaluation and management service by telephone and the availability of in person appointments. I also discussed with the patient that there may be a patient responsible charge related to this service. The patient expressed understanding and agreed to proceed.  Dolores Hoose, RN 11/23/2018  1:49 PM   Order placed for babyscripts and pt instructed in registering and downloading app.  Pt does not have access to a BP cuff at home.  Order placed for BP cuff and message sent to front office staff to fax prescription.    Anatomy ultrasound scheduled for July 29th @ 1315.

## 2018-11-23 NOTE — Progress Notes (Signed)
  Subjective:    Sierra Gamble is being seen today for her first obstetrical visit.  This is not a planned pregnancy. She is at [redacted]w[redacted]d gestation. Her obstetrical history is significant for h/p PTD. Relationship with FOB: significant other, living together. Patient does intend to breast feed. Pregnancy history fully reviewed.  Patient reports no complaints.  Review of Systems:   Review of Systems  Objective:     LMP 09/14/2018 (Exact Date)  Physical Exam  Exam    Assessment:    Pregnancy: [id number] Patient Active Problem List   Diagnosis Date Noted  . Anemia complicating pregnancy, unspecified trimester 12/30/2017  . Supervision of other normal pregnancy, antepartum 12/30/2017  . Preterm labor 12/30/2017  . Pap smear abnormality of cervix with LGSIL 12/30/2017  . Maternal varicella, non-immune 12/30/2017       Plan:     Initial labs drawn. Prenatal vitamins. Problem list reviewed and updated. Role of ultrasound in pregnancy discussed; fetal survey: ordered. Amniocentesis discussed: not indicated. Follow up in 2 weeks for NIPS and pap smear Start 17 P at 15 weeks I rec tylenol and IBU for headaches Baby scripts ordered   Emily Filbert 11/23/2018

## 2018-12-06 ENCOUNTER — Telehealth: Payer: Self-pay | Admitting: Family Medicine

## 2018-12-06 NOTE — Telephone Encounter (Signed)
Called and spoke with the patient about appt, she was instructed to wear a face mask, also no visitors are not allowed due to [place], patient state she have no symptoms and have not been in contact with anyone with the Virus.

## 2018-12-07 ENCOUNTER — Other Ambulatory Visit (HOSPITAL_COMMUNITY)
Admission: RE | Admit: 2018-12-07 | Discharge: 2018-12-07 | Disposition: A | Discharge status: Home / Self Care | Payer: Medicaid Other | Source: Ambulatory Visit | Attending: Obstetrics and Gynecology | Admitting: Obstetrics and Gynecology

## 2018-12-07 ENCOUNTER — Ambulatory Visit (INDEPENDENT_AMBULATORY_CARE_PROVIDER_SITE_OTHER): Payer: Self-pay | Admitting: Obstetrics and Gynecology

## 2018-12-07 ENCOUNTER — Other Ambulatory Visit: Payer: Self-pay

## 2018-12-07 VITALS — BP 108/77 | HR 76 | Wt 132.8 lb

## 2018-12-07 DIAGNOSIS — R87612 Low grade squamous intraepithelial lesion on cytologic smear of cervix (LGSIL): Secondary | ICD-10-CM

## 2018-12-07 DIAGNOSIS — Z3480 Encounter for supervision of other normal pregnancy, unspecified trimester: Secondary | ICD-10-CM | POA: Diagnosis present

## 2018-12-07 DIAGNOSIS — Z3A12 12 weeks gestation of pregnancy: Secondary | ICD-10-CM

## 2018-12-07 NOTE — Progress Notes (Signed)
   PRENATAL VISIT NOTE  Subjective:  Sierra Gamble is a 33 y.o. [id number] at [redacted]w[redacted]d being seen today for ongoing prenatal care.  [name] and has Anemia complicating pregnancy, unspecified trimester; Supervision of other normal pregnancy, antepartum; Preterm labor; Pap smear abnormality of cervix with LGSIL; and Maternal varicella, non-immune on their problem list.  Patient reports no complaints.  Contractions: Not present. Vag. Bleeding: None.  Movement: Absent. Denies leaking of fluid.   The following portions of the patient's history were reviewed and updated as appropriate: allergies, current medications, past family history, past medical history, past social history, past surgical history and problem list.   Objective:   Vitals:   12/07/18 1610  BP: 108/77  Pulse: 76  Weight: 132 lb 12.8 oz (60.2 kg)    Fetal Status: Fetal Heart Rate (bpm): 164   Movement: Absent     General:  Alert, oriented and cooperative. Patient is in no acute distress.  Skin: Skin is warm and dry. No rash noted.   Cardiovascular: Normal heart rate noted  Respiratory: Normal respiratory effort, no problems with respiration noted  Abdomen: Soft, gravid, appropriate for gestational age.  Pain/Pressure: Absent     Pelvic: Cervical exam deferred        Extremities: Normal range of motion.  Edema: None  Mental Status: Normal mood and affect. Normal behavior. Normal judgment and thought content.   Assessment and Plan:  Pregnancy: [id number] at [redacted]w[redacted]d  1. Supervision of other normal pregnancy, antepartum - Cytology - PAP( [place]) - Culture, OB Urine - Genetic Screening - Obstetric Panel, Including HIV  2. Preterm labor in third trimester with preterm delivery, single or unspecified fetus To start Makena 16 weeks  3. Pap smear abnormality of cervix with LGSIL Pap today   Preterm labor symptoms and general obstetric precautions including  but not limited to vaginal bleeding, contractions, leaking of fluid and fetal movement were reviewed in detail with the patient. Please refer to After Visit Summary for other counseling recommendations.   Return in about 4 weeks (around 01/04/2019) for OB visit (MD), in person.  Future Appointments  Date Time Provider [place]  01/25/2019  1:15 PM WH-MFC [place] 4 WH-MFCUS MFC-US    Sloan Leiter, MD

## 2018-12-08 LAB — OBSTETRIC PANEL, INCLUDING HIV
Antibody Screen: NEGATIVE
Basophils Absolute: 0 10*3/uL (ref 0.0–0.2)
Basos: 0 %
EOS (ABSOLUTE): 0.2 10*3/uL (ref 0.0–0.4)
Eos: 2 %
HIV Screen 4th Generation wRfx: NONREACTIVE
Hematocrit: 30.9 % — ABNORMAL LOW (ref 34.0–46.6)
Hemoglobin: 10 g/dL — ABNORMAL LOW (ref 11.1–15.9)
Hepatitis B Surface Ag: NEGATIVE
Immature Grans (Abs): 0 10*3/uL (ref 0.0–0.1)
Immature Granulocytes: 0 %
Lymphocytes Absolute: 2.9 10*3/uL (ref 0.7–3.1)
Lymphs: 27 %
MCH: 23.8 pg — ABNORMAL LOW (ref 26.6–33.0)
MCHC: 32.4 g/dL (ref 31.5–35.7)
MCV: 74 fL — ABNORMAL LOW (ref 79–97)
Monocytes Absolute: 0.9 10*3/uL (ref 0.1–0.9)
Monocytes: 8 %
Neutrophils Absolute: 6.7 10*3/uL (ref 1.4–7.0)
Neutrophils: 63 %
Platelets: 428 10*3/uL (ref 150–450)
RBC: 4.2 x10E6/uL (ref 3.77–5.28)
RDW: 15.1 % (ref 11.7–15.4)
RPR Ser Ql: NONREACTIVE
Rh Factor: POSITIVE
Rubella Antibodies, IGG: <0.9 index — ABNORMAL LOW (ref >0.99)
WBC: 10.8 10*3/uL (ref 3.4–10.8)

## 2018-12-09 LAB — CULTURE, OB URINE

## 2018-12-09 LAB — CYTOLOGY - PAP
Diagnosis: NEGATIVE
HPV: NOT DETECTED

## 2018-12-09 LAB — URINE CULTURE, OB REFLEX

## 2018-12-12 ENCOUNTER — Encounter: Payer: Self-pay | Admitting: Obstetrics and Gynecology

## 2018-12-12 DIAGNOSIS — Z2839 Other underimmunization status: Secondary | ICD-10-CM | POA: Insufficient documentation

## 2018-12-12 DIAGNOSIS — Z283 Underimmunization status: Secondary | ICD-10-CM | POA: Insufficient documentation

## 2018-12-14 ENCOUNTER — Telehealth: Payer: Self-pay | Admitting: General Practice

## 2018-12-14 NOTE — Telephone Encounter (Signed)
Called patient and stated we are calling because I see she has never logged a BP into [facility]. We wanted to make sure she wasn't having issues with the app and if she was able to get her BP cuff. Patient states she hasn't receive her BP cuff yet. Patient states someone called her, a phone number from [place], stating they received the order from [place] but wasn't sure her insurance was going to cover it. Patient states they told her they would look into it and call her back but she hasn't heard anything. Told patient her insurance covers it so I am unsure what the problem is. Told patient I would call the pharmacy and check and we would reach back out to her. Patient verbalized understanding & had no questions.

## 2018-12-20 ENCOUNTER — Encounter: Payer: Self-pay | Admitting: *Deleted

## 2018-12-21 ENCOUNTER — Encounter: Payer: Self-pay | Admitting: *Deleted

## 2018-12-22 ENCOUNTER — Telehealth: Payer: Self-pay | Admitting: *Deleted

## 2018-12-22 ENCOUNTER — Encounter: Payer: Self-pay | Admitting: Obstetrics and Gynecology

## 2018-12-22 DIAGNOSIS — Z148 Genetic carrier of other disease: Secondary | ICD-10-CM | POA: Insufficient documentation

## 2018-12-22 HISTORY — DX: Genetic carrier of other disease: Z14.8

## 2018-12-22 NOTE — Telephone Encounter (Signed)
Received a voicemail from yesterday pm stating are calling re: this patient only has [facility] which will not cover her makena. She needs to get it changed to regular medicaid.Requests a call back  I called and verfied I had gotten her message.they have tried to reach the patient but unsuccessful. I informed her I will try to reach the patient by phone, and by message . They will rerun her makena in a week to see if it is changed to regular medicaid and will call us back if it is not.  I called [facility] and left a message I am calling with some important information- and since you are on MyChart will send you a message there. Call us if questions about the message or receiving the message.  Linda,RN

## 2019-01-03 ENCOUNTER — Telehealth: Payer: Self-pay | Admitting: Medical

## 2019-01-03 NOTE — Telephone Encounter (Signed)
Called the patient to pre-screen. Left a detailed voicemail informing if the patient is experiencing any flu-like symptoms such as fever, chills, cough, or shortness of breath and/or has been in contact with anyone who is suspected of/or confirmed to have COVID19 please call back to reschedule the appointment. Upon entering the office please wear a face mask, sanitize hands, and no visitors or children due to COVID19 restrictions. °

## 2019-01-04 ENCOUNTER — Other Ambulatory Visit: Payer: Self-pay

## 2019-01-04 ENCOUNTER — Ambulatory Visit (INDEPENDENT_AMBULATORY_CARE_PROVIDER_SITE_OTHER): Payer: Medicaid Other | Admitting: Obstetrics and Gynecology

## 2019-01-04 VITALS — BP 95/71 | HR 63 | Temp 98.2°F | Wt 136.5 lb

## 2019-01-04 DIAGNOSIS — O09212 Supervision of pregnancy with history of pre-term labor, second trimester: Secondary | ICD-10-CM | POA: Diagnosis not present

## 2019-01-04 DIAGNOSIS — Z8751 Personal history of pre-term labor: Secondary | ICD-10-CM

## 2019-01-04 DIAGNOSIS — O0990 Supervision of high risk pregnancy, unspecified, unspecified trimester: Secondary | ICD-10-CM

## 2019-01-04 DIAGNOSIS — O0992 Supervision of high risk pregnancy, unspecified, second trimester: Secondary | ICD-10-CM

## 2019-01-04 DIAGNOSIS — Z3A16 16 weeks gestation of pregnancy: Secondary | ICD-10-CM

## 2019-01-04 DIAGNOSIS — O99012 Anemia complicating pregnancy, second trimester: Secondary | ICD-10-CM

## 2019-01-04 DIAGNOSIS — O99019 Anemia complicating pregnancy, unspecified trimester: Secondary | ICD-10-CM

## 2019-01-04 NOTE — Progress Notes (Signed)
Medicaid Home Form Completed-01/04/2019

## 2019-01-04 NOTE — Progress Notes (Signed)
Prenatal Visit Note Date: 01/04/2019 Clinic: [facility]  Subjective:  Sierra Gamble is a 33 y.o. [id number] at [redacted]w[redacted]d being seen today for ongoing prenatal care.  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  Patient reports no complaints.   Contractions: Not present. Vag. Bleeding: None.  Movement: Absent. Denies leaking of fluid.   The following portions of the patient's history were reviewed and updated as appropriate: allergies, current medications, past family history, past medical history, past social history, past surgical history and problem list. Problem list updated.  Objective:   Vitals:   01/04/19 1312  BP: 95/71  Pulse: 63  Temp: 98.2 F (36.8 C)  Weight: 136 lb 8 oz (61.9 kg)    Fetal Status:     Movement: Absent     General:  Alert, oriented and cooperative. Patient is in no acute distress.  Skin: Skin is warm and dry. No rash noted.   Cardiovascular: Normal heart rate noted  Respiratory: Normal respiratory effort, no problems with respiration noted  Abdomen: Soft, gravid, appropriate for gestational age. Pain/Pressure: Absent     Pelvic:  Cervical exam deferred        Extremities: Normal range of motion.  Edema: None  Mental Status: Normal mood and affect. Normal behavior. Normal judgment and thought content.   Urinalysis:      Assessment and Plan:  Pregnancy: D1S9702 at [redacted]w[redacted]d  1. Anemia complicating pregnancy, unspecified trimester Likely due to hgb e. Level not too level. Recommend ferittin with 28wk labs  2. History of preterm delivery Now has preg medicaid. makena ordered. Pt to do subq shots at home  3. Supervision of high risk  pregnancy, antepartum Anatomy already scheduled. Declines afp  Preterm labor symptoms and general obstetric precautions including but not limited to vaginal bleeding, contractions, leaking of fluid and fetal movement were reviewed in detail with the patient. Please refer to After Visit Summary for other counseling recommendations.  Return in about 3 weeks (around 01/25/2019).   Aletha Halim, MD

## 2019-01-05 ENCOUNTER — Telehealth: Payer: Self-pay

## 2019-01-05 NOTE — Telephone Encounter (Signed)
Called Vicente Males back at [facility] OOI:[id number] to verify the phone# that she has on file for Pt, no answer, left VM of pts phone #.

## 2019-01-05 NOTE — Telephone Encounter (Signed)
Received call from Vicente Males at [place] ext: [phone] on Nurse line at 3:52p regarding pt, stating they have been trying to reach her but number on application is invalid.

## 2019-01-09 ENCOUNTER — Telehealth: Payer: Self-pay | Admitting: General Practice

## 2019-01-09 NOTE — Telephone Encounter (Signed)
Received call from [facility] with [facility] stating she is calling regarding patient's 17p injection. Called Vicente Males and she states Medicaid isn't wanting to pay for [facility] because they are showing the patient has additional coverage. She states patient needs to call in with additional information, they have been unable to reach her.   Called patient and provided Anna's number with Makena. Patient states she has additional insurance coverage through [place]. Told patient she just needs to call [facility] and let them know so they can process the prescription and ship her injections. Patient verbalized understanding & had no questions.

## 2019-01-12 ENCOUNTER — Encounter: Payer: Self-pay | Admitting: *Deleted

## 2019-01-13 ENCOUNTER — Telehealth: Payer: Self-pay

## 2019-01-13 NOTE — Telephone Encounter (Signed)
Sierra Gamble at [place] requesting a prior authorization for her Sierra Gamble, patient had medicaid and per Lorriane Shire (front) desk it was switched over to pregnancy medicaid. Called anna and left her a message explaining that the medicaid was active and now currently pregnancy medicaid and that they should re run It and call the office back if needed.

## 2019-01-16 ENCOUNTER — Telehealth: Payer: Self-pay

## 2019-01-16 NOTE — Telephone Encounter (Signed)
Sierra Gamble from [place] care line called and stated that she would send it over again to the compounding pharmacy to see if it goes through so that the patient can get her  [facility] approved.

## 2019-01-25 ENCOUNTER — Other Ambulatory Visit: Payer: Self-pay | Admitting: Obstetrics & Gynecology

## 2019-01-25 ENCOUNTER — Ambulatory Visit (INDEPENDENT_AMBULATORY_CARE_PROVIDER_SITE_OTHER): Payer: Managed Care, Other (non HMO) | Admitting: Family Medicine

## 2019-01-25 ENCOUNTER — Ambulatory Visit (HOSPITAL_COMMUNITY)
Admission: RE | Admit: 2019-01-25 | Discharge: 2019-01-25 | Disposition: A | Discharge status: Home / Self Care | Payer: Managed Care, Other (non HMO) | Source: Ambulatory Visit | Attending: Obstetrics & Gynecology | Admitting: Obstetrics & Gynecology

## 2019-01-25 ENCOUNTER — Other Ambulatory Visit: Payer: Self-pay

## 2019-01-25 VITALS — BP 102/64 | HR 70 | Temp 98.5°F | Wt 139.6 lb

## 2019-01-25 DIAGNOSIS — O99012 Anemia complicating pregnancy, second trimester: Secondary | ICD-10-CM | POA: Diagnosis not present

## 2019-01-25 DIAGNOSIS — O0990 Supervision of high risk pregnancy, unspecified, unspecified trimester: Secondary | ICD-10-CM

## 2019-01-25 DIAGNOSIS — Z3A19 19 weeks gestation of pregnancy: Secondary | ICD-10-CM

## 2019-01-25 DIAGNOSIS — O09212 Supervision of pregnancy with history of pre-term labor, second trimester: Secondary | ICD-10-CM

## 2019-01-25 DIAGNOSIS — Z3480 Encounter for supervision of other normal pregnancy, unspecified trimester: Secondary | ICD-10-CM | POA: Diagnosis not present

## 2019-01-25 DIAGNOSIS — O0992 Supervision of high risk pregnancy, unspecified, second trimester: Secondary | ICD-10-CM

## 2019-01-25 DIAGNOSIS — Z8751 Personal history of pre-term labor: Secondary | ICD-10-CM

## 2019-01-25 MED ORDER — HYDROXYPROGESTERONE CAPROATE 275 MG/1.1ML ~~LOC~~ SOAJ
275.0000 mg | Freq: Once | SUBCUTANEOUS | Status: AC
  Administered 2019-01-25: 275 mg via SUBCUTANEOUS

## 2019-01-25 MED ORDER — OB COMPLETE/DHA 30-10-1-200 MG PO CAPS: 1.0000 | ORAL_CAPSULE | Freq: Every day | ORAL | 1 refills | Status: DC

## 2019-01-25 NOTE — Progress Notes (Signed)
   PRENATAL VISIT NOTE  Subjective:  Sierra Gamble is a 33 y.o. [id number] at [redacted]w[redacted]d being seen today for ongoing prenatal care.  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  Patient reports no complaints.  Contractions: Not present. Vag. Bleeding: None.  Movement: Present. Denies leaking of fluid.   The following portions of the patient's history were reviewed and updated as appropriate: allergies, current medications, past family history, past medical history, past social history, past surgical history and problem list.   Objective:   Vitals:   01/25/19 1433  BP: 102/64  Pulse: 70  Temp: 98.5 F (36.9 C)  Weight: 139 lb 9.6 oz (63.3 kg)    Fetal Status: Fetal Heart Rate (bpm): 154   Movement: Present     General:  Alert, oriented and cooperative. Patient is in no acute distress.  Skin: Skin is warm and dry. No rash noted.   Cardiovascular: Normal heart rate noted  Respiratory: Normal respiratory effort, no problems with respiration noted  Abdomen: Soft, gravid, appropriate for gestational age.  Pain/Pressure: Present     Pelvic: Cervical exam deferred        Extremities: Normal range of motion.  Edema: None  Mental Status: Normal mood and affect. Normal behavior. Normal judgment and thought content.   Assessment and Plan:  Pregnancy: [id number] at [redacted]w[redacted]d 1. Supervision of high risk pregnancy, antepartum Anatomy US today--not yet resulted  2. History of preterm delivery Began 38 P today  Preterm labor symptoms and general obstetric precautions including but not limited to vaginal bleeding, contractions, leaking of fluid and fetal movement were reviewed in detail with the patient.  Please refer to After Visit Summary for other counseling recommendations.   Return in 4 weeks (on 02/22/2019) for [facility], in person, 17 P weekly.  Future Appointments  Date Time Provider [place]  02/02/2019  2:00 PM [place]  02/09/2019  2:30 PM [place]  02/16/2019 10:20 AM [place] WOC  02/22/2019  3:15 PM Donnamae Jude, MD WOC-WOCA WOC  04/05/2019  1:00 PM [place] NURSE [place] MFC-US  04/05/2019  1:00 PM WH-MFC [place] 3 WH-MFCUS MFC-US    Donnamae Jude, MD

## 2019-01-25 NOTE — Addendum Note (Signed)
Addended by: Bethanne Ginger on: 01/25/2019 04:30 PM   Modules accepted: Orders

## 2019-01-25 NOTE — Patient Instructions (Signed)

## 2019-01-26 ENCOUNTER — Other Ambulatory Visit (HOSPITAL_COMMUNITY): Payer: Self-pay | Admitting: *Deleted

## 2019-01-26 DIAGNOSIS — O99012 Anemia complicating pregnancy, second trimester: Secondary | ICD-10-CM

## 2019-02-02 ENCOUNTER — Ambulatory Visit: Payer: Managed Care, Other (non HMO)

## 2019-02-02 ENCOUNTER — Other Ambulatory Visit: Payer: Self-pay

## 2019-02-02 DIAGNOSIS — Z8751 Personal history of pre-term labor: Secondary | ICD-10-CM

## 2019-02-02 NOTE — Progress Notes (Signed)
Pt here today for Makena SQ injection.  Pt medication not here at the office.  Pt informed me that she had spoken with Vicente Males from [place] who supoose to call the office yesterday to f/u on pt's medication due to insurance issues.  I called Vicente Males, care mangement, @ [phone] ext [phone] and LM to please call the office in regards to pt's [place].  I informed pt that I would call her by Friday at 12 whether or not have reached someone or not.  Pt agreed.    Mel Almond, RN 02/02/19

## 2019-02-07 ENCOUNTER — Telehealth: Payer: Self-pay | Admitting: General Practice

## 2019-02-07 NOTE — Telephone Encounter (Signed)
Received notification from [facility] patient's insurance requires PA on [place]. Called [employer] and initiated a PA- received case id #[id number]. [place] called & notified.

## 2019-02-08 ENCOUNTER — Telehealth: Payer: Self-pay | Admitting: Emergency Medicine

## 2019-02-08 NOTE — Telephone Encounter (Signed)
Pharmacy called and left a voicemail stating they needed a makena issue date for this pt. Call back number is [phone] and select option 2 to reach them regarding this message.

## 2019-02-09 ENCOUNTER — Other Ambulatory Visit: Payer: Self-pay

## 2019-02-09 ENCOUNTER — Ambulatory Visit (INDEPENDENT_AMBULATORY_CARE_PROVIDER_SITE_OTHER): Payer: Managed Care, Other (non HMO) | Admitting: General Practice

## 2019-02-09 VITALS — BP 101/64 | HR 78 | Ht 61.0 in | Wt 141.0 lb

## 2019-02-09 DIAGNOSIS — Z8751 Personal history of pre-term labor: Secondary | ICD-10-CM

## 2019-02-09 DIAGNOSIS — Z3A21 21 weeks gestation of pregnancy: Secondary | ICD-10-CM

## 2019-02-09 DIAGNOSIS — O09212 Supervision of pregnancy with history of pre-term labor, second trimester: Secondary | ICD-10-CM

## 2019-02-09 MED ORDER — HYDROXYPROGESTERONE CAPROATE 275 MG/1.1ML ~~LOC~~ SOAJ
275.0000 mg | Freq: Once | SUBCUTANEOUS | Status: AC
  Administered 2019-02-09: 275 mg via SUBCUTANEOUS

## 2019-02-09 NOTE — Progress Notes (Signed)
Patient seen and assessed by nursing staff.  Agree with documentation and plan.  

## 2019-02-09 NOTE — Progress Notes (Signed)
Sierra Gamble here for 17-P  Injection.  Injection administered without complication. Patient will return in one week for next injection. Taught patient how to self administer Makena at home using in office demonstrator. Patient provided return demonstration.  Derinda Late, RN 02/09/2019  2:48 PM

## 2019-02-09 NOTE — Telephone Encounter (Signed)
[place] and provided needed information for shipment. They will call the patient later today to schedule shipment to her house.

## 2019-02-16 ENCOUNTER — Ambulatory Visit: Payer: Managed Care, Other (non HMO)

## 2019-02-22 ENCOUNTER — Ambulatory Visit (INDEPENDENT_AMBULATORY_CARE_PROVIDER_SITE_OTHER): Payer: Managed Care, Other (non HMO) | Admitting: Family Medicine

## 2019-02-22 ENCOUNTER — Other Ambulatory Visit: Payer: Self-pay

## 2019-02-22 VITALS — BP 108/73 | HR 90 | Wt 145.0 lb

## 2019-02-22 DIAGNOSIS — O0990 Supervision of high risk pregnancy, unspecified, unspecified trimester: Secondary | ICD-10-CM

## 2019-02-22 DIAGNOSIS — Z23 Encounter for immunization: Secondary | ICD-10-CM

## 2019-02-22 DIAGNOSIS — O09212 Supervision of pregnancy with history of pre-term labor, second trimester: Secondary | ICD-10-CM

## 2019-02-22 DIAGNOSIS — Z8751 Personal history of pre-term labor: Secondary | ICD-10-CM

## 2019-02-22 DIAGNOSIS — Z3A23 23 weeks gestation of pregnancy: Secondary | ICD-10-CM

## 2019-02-22 DIAGNOSIS — Z2839 Other underimmunization status: Secondary | ICD-10-CM

## 2019-02-22 DIAGNOSIS — O0992 Supervision of high risk pregnancy, unspecified, second trimester: Secondary | ICD-10-CM

## 2019-02-22 DIAGNOSIS — Z283 Underimmunization status: Secondary | ICD-10-CM

## 2019-02-22 NOTE — Progress Notes (Signed)
   PRENATAL VISIT NOTE  Subjective:  Sierra Gamble is a 33 y.o. [id number] at 26w0dbeing seen today for ongoing prenatal care.  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  Patient reports no complaints.  Contractions: Not present. Vag. Bleeding: None.  Movement: Present. Denies leaking of fluid.   The following portions of the patient's history were reviewed and updated as appropriate: allergies, current medications, past family history, past medical history, past social history, past surgical history and problem list.   Objective:   Vitals:   02/22/19 1516  BP: 108/73  Pulse: 90  Weight: 145 lb (65.8 kg)    Fetal Status: Fetal Heart Rate (bpm): 146 Fundal Height: 23 cm Movement: Present     General:  Alert, oriented and cooperative. Patient is in no acute distress.  Skin: Skin is warm and dry. No rash noted.   Cardiovascular: Normal heart rate noted  Respiratory: Normal respiratory effort, no problems with respiration noted  Abdomen: Soft, gravid, appropriate for gestational age.  Pain/Pressure: Present     Pelvic: Cervical exam deferred        Extremities: Normal range of motion.  Edema: None  Mental Status: Normal mood and affect. Normal behavior. Normal judgment and thought content.   Assessment and Plan:  Pregnancy: [id number] 246w0d. Supervision of high risk pregnancy, antepartum Continue prenatal care.  - Flu Vaccine QUAD 36+ mos IM  2. History of preterm delivery Awaiting 17 P  3. Rubella non-immune status, antepartum Will need MMR pp  Preterm labor symptoms and general obstetric precautions including but not limited to vaginal bleeding, contractions, leaking of  fluid and fetal movement were reviewed in detail with the patient. Please refer to After Visit Summary for other counseling recommendations.   Return in 4 weeks (on 03/22/2019) [place] weekly, 28 wk labs.  Future Appointments  Date Time Provider [place]9/06/2018  2:50 PM WOJordanOGlen [place]9/02/2019  2:20 PM WOLiberty Hill[place]9/16/2020  1:30 PM WOW[place]sorOOlm[place]9/23/2020  8:20 AM WOC-WOCA LAB WOC-WOCA WOC  03/22/2019  8:55 AM PiAletha HalimMD WOC-WOCA WOC  04/05/2019  1:00 PM WHArpelarURSE WHSprayFC-US  04/05/2019  1:00 PM WH-MFC [place] WH-MFCUS MFC-US    TaDonnamae JudeMD

## 2019-02-22 NOTE — Patient Instructions (Signed)

## 2019-02-23 ENCOUNTER — Encounter: Payer: Self-pay | Admitting: *Deleted

## 2019-02-23 ENCOUNTER — Telehealth: Payer: Self-pay | Admitting: *Deleted

## 2019-02-23 ENCOUNTER — Other Ambulatory Visit: Payer: Self-pay | Admitting: *Deleted

## 2019-02-23 DIAGNOSIS — O0990 Supervision of high risk pregnancy, unspecified, unspecified trimester: Secondary | ICD-10-CM

## 2019-02-23 MED ORDER — BLOOD PRESSURE KIT DEVI: 1.0000 | 0 refills | Status: DC | PRN

## 2019-02-23 NOTE — Telephone Encounter (Signed)
Received a voice message from Ivin Booty, a [place] with [facility] [employer] stating she spoke with [facility] yesterday. States Sierra Gamble said she has not yet received her Darol Destine and it has been 2 weeks since last injection. States she wanted to reach out and help in any way she can. States she contacted [facility] last evening and they said they will reach out to [place] re: shipment.  States she just wanted to touch base with [place] and let us know she spoke with [facility] and will be following her throughout the pregnancy and postpartum.  You may call her at any time if she can help or with any questions. Will also be sending our office an updated plan of care monthly. Khloie Hamada,RN

## 2019-02-27 ENCOUNTER — Telehealth: Payer: Self-pay

## 2019-02-27 NOTE — Telephone Encounter (Signed)
[place] @ [phone] and selected option 2 about patients Sierra Gamble. Pharmacy stated that  Patient needed a prior approval for the auto injectors and was submitted on 8/26 and angela stated that the RX was not going through because her insurance would  Not cover the auto injectors. She stated that they would need a verbal call in of the 250 IM medications and could possibly have it overnight. Gave angela Dr.Pratts NPI and called in the new prescription. She advised me to call back tomorrow for the status of it and to have it overnight to the patients home.  Called patient and advised. She verbalized understanding.

## 2019-02-28 ENCOUNTER — Ambulatory Visit: Payer: Managed Care, Other (non HMO)

## 2019-02-28 ENCOUNTER — Telehealth (INDEPENDENT_AMBULATORY_CARE_PROVIDER_SITE_OTHER): Payer: Managed Care, Other (non HMO) | Admitting: General Practice

## 2019-02-28 DIAGNOSIS — Z8751 Personal history of pre-term labor: Secondary | ICD-10-CM

## 2019-02-28 NOTE — Telephone Encounter (Signed)
Received a phone call from Vicente Males at [facility] who states she just got off the phone with [place] & the patient. She states that the prior Josem Kaufmann is for generic not name brand and if we want the name brand that needs a PA.  Called Vicente Males back, no answer- left message stating the patient can have generic or brand, IM or auto injectors, which ever. The patient just needs to start the medication asap. Asked she call us back so we can do whatever needs to happen to get the patient her medication.

## 2019-03-01 NOTE — Telephone Encounter (Signed)
Called Sierra Gamble at [facility] again and she states the PA that was approved was for generic Makena and the pharmacy would need to process the Rx for the generic as they currently have the prescription for the brand. Anna transferred my call to [facility] who states they are processing the prescription for the generic and will need to run it through her insurance for copay amount. They have expedited this process and have assured me it should take 24-48 hours and the patient should have the Rx mailed to her and received by the end of the week if not sooner. Called and informed patient. Advised she call us back if she hasn't heard anything from the pharmacy by Friday. She verbalized understanding & had no questions.

## 2019-03-02 ENCOUNTER — Telehealth: Payer: Self-pay | Admitting: General Practice

## 2019-03-02 NOTE — Telephone Encounter (Signed)
Received phone call from [place] there was an issue processing her prescription and a PA needed to be completed.  Called [facility] and they state we need to call her insurance to check on the PA. Told pharmacy assistant that a PA was approved 3 weeks ago so why do I need to call her insurance to check on it. She states that's what the pharmacist informed her, she isn't sure.  Called patient's insurance and was informed the PA was approved 3 weeks ago and there isn't a problem with the pharmacy filing the prescription. Called [facility] back and spoke with Lead Pharmacist who assures me Rx has been processed and is ready for shipment. She got the patient on the phone and verified shipment with her and copay. Rx will be shipped to the office & should arrive by Tuesday Sept 8. Patient has been informed and has office appt Sept 9.

## 2019-03-07 ENCOUNTER — Telehealth: Payer: Self-pay | Admitting: Student

## 2019-03-07 NOTE — Telephone Encounter (Signed)
Called the patient and left a detailed voicemail of the upcoming appointment. Informed the patient if she has been diagnosed with covid19, in close contact with someone who has had covid19, or experienced any flu-like symptoms such as fever, rash, sore throat, or shortness of breath please call our office to reschedule the appointment. If you have any questions or concerns, please give our office a call at [phone]. °

## 2019-03-08 ENCOUNTER — Other Ambulatory Visit: Payer: Self-pay

## 2019-03-08 ENCOUNTER — Ambulatory Visit (INDEPENDENT_AMBULATORY_CARE_PROVIDER_SITE_OTHER): Payer: Managed Care, Other (non HMO) | Admitting: General Practice

## 2019-03-08 VITALS — BP 115/75 | Ht 61.0 in | Wt 148.0 lb

## 2019-03-08 DIAGNOSIS — O09212 Supervision of pregnancy with history of pre-term labor, second trimester: Secondary | ICD-10-CM | POA: Diagnosis not present

## 2019-03-08 DIAGNOSIS — Z8751 Personal history of pre-term labor: Secondary | ICD-10-CM

## 2019-03-08 MED ORDER — HYDROXYPROGESTERONE CAPROATE 250 MG/ML IM OIL
250.0000 mg | TOPICAL_OIL | INTRAMUSCULAR | Status: DC
  Administered 2019-03-08 – 2019-05-12 (×8): 250 mg via INTRAMUSCULAR

## 2019-03-08 NOTE — Progress Notes (Signed)
[name] here for 17-P  Injection.  Injection administered without complication. Patient will return in one week for next injection.  Derinda Late, RN 03/08/2019  2:29 PM

## 2019-03-09 ENCOUNTER — Encounter: Payer: Self-pay | Admitting: *Deleted

## 2019-03-13 NOTE — Progress Notes (Signed)
I have reviewed this chart and agree with the RN/CMA assessment and management.    Rashan Patient C Anamae Rochelle, MD, FACOG Attending Physician, [facility]  

## 2019-03-15 ENCOUNTER — Other Ambulatory Visit: Payer: Self-pay

## 2019-03-15 ENCOUNTER — Ambulatory Visit (INDEPENDENT_AMBULATORY_CARE_PROVIDER_SITE_OTHER): Payer: Managed Care, Other (non HMO) | Admitting: General Practice

## 2019-03-15 VITALS — BP 109/74 | HR 76 | Ht 61.0 in | Wt 147.0 lb

## 2019-03-15 DIAGNOSIS — Z3A26 26 weeks gestation of pregnancy: Secondary | ICD-10-CM

## 2019-03-15 DIAGNOSIS — O09212 Supervision of pregnancy with history of pre-term labor, second trimester: Secondary | ICD-10-CM | POA: Diagnosis not present

## 2019-03-15 DIAGNOSIS — Z8751 Personal history of pre-term labor: Secondary | ICD-10-CM

## 2019-03-15 NOTE — Progress Notes (Signed)
Bryanna Aggarwal here for 17-P  Injection.  Injection administered without complication. Patient will return in one week for next injection.  Derinda Late, RN 03/15/2019  1:39 PM

## 2019-03-16 NOTE — Progress Notes (Signed)
Patient seen and assessed by nursing staff during this encounter. I have reviewed the chart and agree with the documentation and plan.  Aletha Halim, MD 03/16/2019 10:44 AM

## 2019-03-21 ENCOUNTER — Telehealth: Payer: Self-pay | Admitting: Obstetrics and Gynecology

## 2019-03-21 ENCOUNTER — Other Ambulatory Visit: Payer: Self-pay | Admitting: *Deleted

## 2019-03-21 DIAGNOSIS — Z8751 Personal history of pre-term labor: Secondary | ICD-10-CM

## 2019-03-21 DIAGNOSIS — O0990 Supervision of high risk pregnancy, unspecified, unspecified trimester: Secondary | ICD-10-CM

## 2019-03-21 NOTE — Telephone Encounter (Signed)
Spoke to patient about her appointment on 9/23 @ 8:20. Patient instructed to wear a face mask for the entire appointment and no visitors are allowed with her during the visit. Patient screened for covid symptoms and denied having any

## 2019-03-22 ENCOUNTER — Other Ambulatory Visit: Payer: Managed Care, Other (non HMO)

## 2019-03-22 ENCOUNTER — Encounter: Payer: Self-pay | Admitting: Obstetrics and Gynecology

## 2019-03-22 ENCOUNTER — Ambulatory Visit (INDEPENDENT_AMBULATORY_CARE_PROVIDER_SITE_OTHER): Payer: Managed Care, Other (non HMO) | Admitting: Obstetrics and Gynecology

## 2019-03-22 ENCOUNTER — Other Ambulatory Visit: Payer: Self-pay

## 2019-03-22 VITALS — BP 113/70 | HR 75 | Wt 149.1 lb

## 2019-03-22 DIAGNOSIS — Z8751 Personal history of pre-term labor: Secondary | ICD-10-CM

## 2019-03-22 DIAGNOSIS — O0992 Supervision of high risk pregnancy, unspecified, second trimester: Secondary | ICD-10-CM

## 2019-03-22 DIAGNOSIS — Z23 Encounter for immunization: Secondary | ICD-10-CM | POA: Diagnosis not present

## 2019-03-22 DIAGNOSIS — O0990 Supervision of high risk pregnancy, unspecified, unspecified trimester: Secondary | ICD-10-CM

## 2019-03-22 DIAGNOSIS — O09212 Supervision of pregnancy with history of pre-term labor, second trimester: Secondary | ICD-10-CM

## 2019-03-22 NOTE — Progress Notes (Signed)
Prenatal Visit Note Date: 03/22/2019 Clinic: [facility]  Subjective:  Sierra Gamble is a 33 y.o. [id number] at [redacted]w[redacted]d being seen today for ongoing prenatal care.  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  Patient reports no complaints.   Contractions: Not present. Vag. Bleeding: None.  Movement: Present. Denies leaking of fluid.   The following portions of the patient's history were reviewed and updated as appropriate: allergies, current medications, past family history, past medical history, past social history, past surgical history and problem list. Problem list updated.  Objective:   Vitals:   03/22/19 0828  BP: 113/70  Pulse: 75  Weight: 149 lb 1.6 oz (67.6 kg)    Fetal Status: Fetal Heart Rate (bpm): 149   Movement: Present     General:  Alert, oriented and cooperative. Patient is in no acute distress.  Skin: Skin is warm and dry. No rash noted.   Cardiovascular: Normal heart rate noted  Respiratory: Normal respiratory effort, no problems with respiration noted  Abdomen: Soft, gravid, appropriate for gestational age. Pain/Pressure: Absent     Pelvic:  Cervical exam deferred        Extremities: Normal range of motion.  Edema: None  Mental Status: Normal mood and affect. Normal behavior. Normal judgment and thought content.   Urinalysis:      Assessment and Plan:  Pregnancy: [id number] at [redacted]w[redacted]d  1. Supervision of high risk pregnancy, antepartum Routine care. 28wk labs today. D/w her re: bc nv. Has follow up anatomy early october - Tdap vaccine greater than or equal to 7yo IM  2. History of preterm delivery 17p today. Continue qwk  Preterm labor  symptoms and general obstetric precautions including but not limited to vaginal bleeding, contractions, leaking of fluid and fetal movement were reviewed in detail with the patient. Please refer to After Visit Summary for other counseling recommendations.  Return in about 1 week (around 03/29/2019) for qwk 17p appts. 10/7 rob (low risk or hrob) in person, same day as u/s.   Aletha Halim, MD

## 2019-03-23 LAB — CBC
Hematocrit: 27 % — ABNORMAL LOW (ref 34.0–46.6)
Hemoglobin: 8.9 g/dL — ABNORMAL LOW (ref 11.1–15.9)
MCH: 24.9 pg — ABNORMAL LOW (ref 26.6–33.0)
MCHC: 33 g/dL (ref 31.5–35.7)
MCV: 76 fL — ABNORMAL LOW (ref 79–97)
Platelets: 355 10*3/uL (ref 150–450)
RBC: 3.57 x10E6/uL — ABNORMAL LOW (ref 3.77–5.28)
RDW: 14.4 % (ref 11.7–15.4)
WBC: 10 10*3/uL (ref 3.4–10.8)

## 2019-03-23 LAB — HIV ANTIBODY (ROUTINE TESTING W REFLEX): HIV Screen 4th Generation wRfx: NONREACTIVE

## 2019-03-23 LAB — RPR: RPR Ser Ql: NONREACTIVE

## 2019-03-23 LAB — GLUCOSE TOLERANCE, 2 HOURS W/ 1HR
Glucose, 1 hour: 188 mg/dL — ABNORMAL HIGH (ref 65–179)
Glucose, 2 hour: 140 mg/dL (ref 65–152)
Glucose, Fasting: 73 mg/dL (ref 65–91)

## 2019-03-27 ENCOUNTER — Telehealth: Payer: Self-pay | Admitting: *Deleted

## 2019-03-27 ENCOUNTER — Encounter: Payer: Self-pay | Admitting: *Deleted

## 2019-03-27 NOTE — Telephone Encounter (Addendum)
-----   Message from Aletha Halim, MD sent at 03/23/2019 12:29 PM EDT ----- 1) please set her up for GDM testing, etc? 2) can you have [facility] add on a ferritin to her existing lab work? Thanks  9/28  1655  Called pt and left message stating that I am calling with test result information. I will send a MyChart message with the details. Message sent to front office staff to schedule appt for Diabetes Education. Ferritin level added to labs drawn on 9/23 as requested.

## 2019-03-28 ENCOUNTER — Telehealth: Payer: Self-pay | Admitting: Family Medicine

## 2019-03-28 NOTE — Telephone Encounter (Signed)
Spoke to patient about her appointment on 9/30 @ 9:20. Patient instructed to wear a face mask for the entire appointment and no visitors are allowed with her during the visit. Patient screened for covid symptoms and denied having any

## 2019-03-29 ENCOUNTER — Ambulatory Visit (INDEPENDENT_AMBULATORY_CARE_PROVIDER_SITE_OTHER): Payer: Managed Care, Other (non HMO)

## 2019-03-29 ENCOUNTER — Other Ambulatory Visit: Payer: Self-pay

## 2019-03-29 VITALS — BP 117/80 | HR 90 | Wt 149.5 lb

## 2019-03-29 DIAGNOSIS — O09213 Supervision of pregnancy with history of pre-term labor, third trimester: Secondary | ICD-10-CM | POA: Diagnosis not present

## 2019-03-29 DIAGNOSIS — Z8751 Personal history of pre-term labor: Secondary | ICD-10-CM

## 2019-03-29 MED ORDER — HYDROXYPROGESTERONE CAPROATE 250 MG/ML IM OIL: 250.0000 mg | TOPICAL_OIL | Freq: Once | INTRAMUSCULAR | Status: DC

## 2019-03-29 NOTE — Progress Notes (Signed)
Agree with A & P. 

## 2019-03-29 NOTE — Progress Notes (Signed)
Sanjana Yingst here for 17-P  Injection.  Injection administered without complication. Patient will return in one week for next injection.  [place] [phone] and medication refilled.  Annabell Howells, RN 03/29/2019  9:57 AM

## 2019-03-30 LAB — SPECIMEN STATUS REPORT

## 2019-04-05 ENCOUNTER — Other Ambulatory Visit: Payer: Self-pay

## 2019-04-05 ENCOUNTER — Ambulatory Visit (HOSPITAL_COMMUNITY)
Admission: RE | Admit: 2019-04-05 | Discharge: 2019-04-05 | Disposition: A | Discharge status: Home / Self Care | Payer: Managed Care, Other (non HMO) | Source: Ambulatory Visit | Attending: Obstetrics and Gynecology | Admitting: Obstetrics and Gynecology

## 2019-04-05 ENCOUNTER — Encounter: Payer: Managed Care, Other (non HMO) | Attending: Family Medicine | Admitting: *Deleted

## 2019-04-05 ENCOUNTER — Ambulatory Visit (HOSPITAL_COMMUNITY): Payer: Managed Care, Other (non HMO) | Admitting: *Deleted

## 2019-04-05 ENCOUNTER — Ambulatory Visit: Payer: Managed Care, Other (non HMO) | Admitting: *Deleted

## 2019-04-05 ENCOUNTER — Other Ambulatory Visit (HOSPITAL_COMMUNITY): Payer: Self-pay | Admitting: *Deleted

## 2019-04-05 ENCOUNTER — Other Ambulatory Visit (HOSPITAL_COMMUNITY): Payer: Self-pay | Admitting: Maternal & Fetal Medicine

## 2019-04-05 ENCOUNTER — Encounter (HOSPITAL_COMMUNITY): Payer: Self-pay

## 2019-04-05 VITALS — BP 99/64 | HR 80 | Temp 98.5°F

## 2019-04-05 DIAGNOSIS — Z148 Genetic carrier of other disease: Secondary | ICD-10-CM | POA: Diagnosis not present

## 2019-04-05 DIAGNOSIS — O24410 Gestational diabetes mellitus in pregnancy, diet controlled: Secondary | ICD-10-CM | POA: Diagnosis not present

## 2019-04-05 DIAGNOSIS — O09213 Supervision of pregnancy with history of pre-term labor, third trimester: Secondary | ICD-10-CM

## 2019-04-05 DIAGNOSIS — Z362 Encounter for other antenatal screening follow-up: Secondary | ICD-10-CM

## 2019-04-05 DIAGNOSIS — Z3A00 Weeks of gestation of pregnancy not specified: Secondary | ICD-10-CM | POA: Insufficient documentation

## 2019-04-05 DIAGNOSIS — O365930 Maternal care for other known or suspected poor fetal growth, third trimester, not applicable or unspecified: Secondary | ICD-10-CM | POA: Insufficient documentation

## 2019-04-05 DIAGNOSIS — O0990 Supervision of high risk pregnancy, unspecified, unspecified trimester: Secondary | ICD-10-CM | POA: Insufficient documentation

## 2019-04-05 DIAGNOSIS — O99013 Anemia complicating pregnancy, third trimester: Secondary | ICD-10-CM

## 2019-04-05 DIAGNOSIS — O99012 Anemia complicating pregnancy, second trimester: Secondary | ICD-10-CM

## 2019-04-05 DIAGNOSIS — Z3A29 29 weeks gestation of pregnancy: Secondary | ICD-10-CM

## 2019-04-05 MED ORDER — GLUCOSE BLOOD VI STRP: ORAL_STRIP | 12 refills | Status: DC

## 2019-04-05 MED ORDER — ACCU-CHEK FASTCLIX LANCETS MISC: 1.0000 | Freq: Four times a day (QID) | 12 refills | Status: DC

## 2019-04-05 MED ORDER — ACCU-CHEK GUIDE W/DEVICE KIT: 1.0000 | PACK | Freq: Four times a day (QID) | 0 refills | Status: DC

## 2019-04-06 ENCOUNTER — Ambulatory Visit (INDEPENDENT_AMBULATORY_CARE_PROVIDER_SITE_OTHER): Payer: Managed Care, Other (non HMO) | Admitting: Obstetrics and Gynecology

## 2019-04-06 VITALS — BP 109/57 | HR 76 | Temp 98.3°F | Wt 152.0 lb

## 2019-04-06 DIAGNOSIS — O365930 Maternal care for other known or suspected poor fetal growth, third trimester, not applicable or unspecified: Secondary | ICD-10-CM

## 2019-04-06 DIAGNOSIS — Z148 Genetic carrier of other disease: Secondary | ICD-10-CM

## 2019-04-06 DIAGNOSIS — Z8751 Personal history of pre-term labor: Secondary | ICD-10-CM

## 2019-04-06 DIAGNOSIS — O99019 Anemia complicating pregnancy, unspecified trimester: Secondary | ICD-10-CM

## 2019-04-06 DIAGNOSIS — O0993 Supervision of high risk pregnancy, unspecified, third trimester: Secondary | ICD-10-CM

## 2019-04-06 DIAGNOSIS — Z3A29 29 weeks gestation of pregnancy: Secondary | ICD-10-CM

## 2019-04-06 DIAGNOSIS — O99013 Anemia complicating pregnancy, third trimester: Secondary | ICD-10-CM

## 2019-04-06 DIAGNOSIS — O0990 Supervision of high risk pregnancy, unspecified, unspecified trimester: Secondary | ICD-10-CM

## 2019-04-06 HISTORY — DX: Maternal care for other known or suspected poor fetal growth, third trimester, not applicable or unspecified: O36.5930

## 2019-04-06 MED ORDER — HYDROXYPROGESTERONE CAPROATE 250 MG/ML IM OIL
250.0000 mg | TOPICAL_OIL | Freq: Once | INTRAMUSCULAR | Status: AC
  Administered 2019-04-06: 14:00:00 250 mg via INTRAMUSCULAR

## 2019-04-06 NOTE — Progress Notes (Signed)
  Patient was seen on 04/05/2019 for Gestational Diabetes self-management. EDD 06/21/2019. 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 Diet history obtained. Patient eats good variety of all food groups with occasional sweets or a milkshake. Beverages include water, juice, sweet tea.  The following learning objectives were met by the patient :   States the definition of Gestational Diabetes  States why dietary management is important in controlling blood glucose  Describes the effects of carbohydrates on blood glucose levels  Demonstrates ability to create a balanced meal plan  Demonstrates carbohydrate counting   States when to check blood glucose levels  Demonstrates proper blood glucose monitoring techniques  States the effect of stress and exercise on blood glucose levels  States the importance of limiting caffeine and abstaining from alcohol and smoking  Plan:  Aim for 3 Carb Choices per meal (45 grams) +/- 1 either way  Aim for 1-2 Carbs per snack Begin reading food labels for Total Carbohydrate of foods If OK with your MD, consider  increasing your activity level by walking, Arm Chair Exercises or other activity daily as tolerated Begin checking BG before breakfast and 2 hours after first bite of breakfast, lunch and dinner as directed by MD  Bring Log Book/Sheet and meter to every medical appointment OR use Baby Scripts (see below) Baby Scripts:  Patient was introduced to [employer] and plans to use as record of BG electronically  Take medication if directed by MD  Blood glucose monitor Rx called into pharmacy: Accu Check Guide with Fast Clix drums Patient instructed to test pre breakfast and 2 hours each meal as directed by MD  Patient instructed to monitor glucose levels: FBS: 60 - 95 mg/dl 2 hour: <120 mg/dl  Patient received the following handouts:  Nutrition  Diabetes and Pregnancy  Carbohydrate Counting List  Patient will be seen for follow-up as needed.

## 2019-04-06 NOTE — Progress Notes (Signed)
Prenatal Visit Note Date: 04/06/2019 Clinic: [facility]  Subjective:  Sierra Gamble is a 33 y.o. [id number] at [redacted]w[redacted]d being seen today for ongoing prenatal care.  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  Patient reports no complaints.   Contractions: Not present. Vag. Bleeding: None.  Movement: Present. Denies leaking of fluid.   The following portions of the patient's history were reviewed and updated as appropriate: allergies, current medications, past family history, past medical history, past social history, past surgical history and problem list. Problem list updated.  Objective:   Vitals:   04/06/19 1349  BP: (!) 109/57  Pulse: 76  Temp: 98.3 F (36.8 C)  Weight: 152 lb (68.9 kg)    Fetal Status: Fetal Heart Rate (bpm): 144   Movement: Present     General:  Alert, oriented and cooperative. Patient is in no acute distress.  Skin: Skin is warm and dry. No rash noted.   Cardiovascular: Normal heart rate noted  Respiratory: Normal respiratory effort, no problems with respiration noted  Abdomen: Soft, gravid, appropriate for gestational age. Pain/Pressure: Absent     Pelvic:  Cervical exam deferred        Extremities: Normal range of motion.  Edema: None  Mental Status: Normal mood and affect. Normal behavior. Normal judgment and thought content.   Urinalysis:      Assessment and Plan:  Pregnancy: [id number] at [redacted]w[redacted]d  1. History of preterm delivery Continue with qwk visits - hydroxyprogesterone caproate (MAKENA) 250 mg/mL injection 250 mg  2. Poor fetal growth affecting management  of mother in third trimester, single or unspecified fetus Dx yesterday. Rpt u/s in 2 and 3 weeks with mfm. FKC precautions given  3. Anemia complicating pregnancy, unspecified trimester Unable to be run off sample from last visit. D/w her that will likely need IV iron - Anemia panel  4. Supervision of high risk pregnancy, antepartum Routine care  5. 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 GDM Saw education yesterday and to pick up supplies today. F/u CBG log next week  Preterm labor symptoms and general obstetric precautions including but not limited to vaginal bleeding, contractions, leaking of fluid and fetal movement were reviewed in detail with the patient. Please refer to After Visit Summary for other counseling recommendations.  Return in about 1 week (around 04/13/2019) for high risk, in person.   Aletha Halim, MD

## 2019-04-07 LAB — ANEMIA PANEL
Ferritin: 17 ng/mL (ref 15–150)
Folate, Hemolysate: 468 ng/mL
Folate, RBC: 1740 ng/mL (ref >498)
Hematocrit: 26.9 % — ABNORMAL LOW (ref 34.0–46.6)
Iron Saturation: 13 % — ABNORMAL LOW (ref 15–55)
Iron: 59 ug/dL (ref 27–159)
Retic Ct Pct: 2.4 % (ref 0.6–2.6)
Total Iron Binding Capacity: 466 ug/dL — ABNORMAL HIGH (ref 250–450)
UIBC: 407 ug/dL (ref 131–425)
Vitamin B-12: 323 pg/mL (ref 232–1245)

## 2019-04-10 ENCOUNTER — Encounter: Payer: Self-pay | Admitting: *Deleted

## 2019-04-11 ENCOUNTER — Other Ambulatory Visit: Payer: Self-pay | Admitting: Obstetrics and Gynecology

## 2019-04-11 ENCOUNTER — Telehealth: Payer: Self-pay | Admitting: *Deleted

## 2019-04-11 NOTE — Telephone Encounter (Signed)
-----   Message from Aletha Halim, MD sent at 04/11/2019  1:06 PM EDT ----- Please set her up for IV feraheme. thanks

## 2019-04-11 NOTE — Telephone Encounter (Signed)
I scheduled fereheme for 04/17/19 at 0900 . I called Lisseth and informed her Dr.Pickens has reviewed her labs and he has ordered IV fereheme. I gave her appointment date/ time/ location . She voices understanding. Linda,RN

## 2019-04-13 ENCOUNTER — Other Ambulatory Visit: Payer: Self-pay

## 2019-04-13 ENCOUNTER — Ambulatory Visit (INDEPENDENT_AMBULATORY_CARE_PROVIDER_SITE_OTHER): Payer: Managed Care, Other (non HMO) | Admitting: Medical

## 2019-04-13 ENCOUNTER — Encounter: Payer: Self-pay | Admitting: Medical

## 2019-04-13 VITALS — BP 115/51 | HR 85 | Wt 151.4 lb

## 2019-04-13 DIAGNOSIS — Z3A30 30 weeks gestation of pregnancy: Secondary | ICD-10-CM | POA: Diagnosis not present

## 2019-04-13 DIAGNOSIS — O24410 Gestational diabetes mellitus in pregnancy, diet controlled: Secondary | ICD-10-CM

## 2019-04-13 DIAGNOSIS — Z148 Genetic carrier of other disease: Secondary | ICD-10-CM

## 2019-04-13 DIAGNOSIS — Z2839 Other underimmunization status: Secondary | ICD-10-CM

## 2019-04-13 DIAGNOSIS — O0990 Supervision of high risk pregnancy, unspecified, unspecified trimester: Secondary | ICD-10-CM

## 2019-04-13 DIAGNOSIS — O99013 Anemia complicating pregnancy, third trimester: Secondary | ICD-10-CM

## 2019-04-13 DIAGNOSIS — Z283 Underimmunization status: Secondary | ICD-10-CM

## 2019-04-13 DIAGNOSIS — O0993 Supervision of high risk pregnancy, unspecified, third trimester: Secondary | ICD-10-CM

## 2019-04-13 DIAGNOSIS — O99019 Anemia complicating pregnancy, unspecified trimester: Secondary | ICD-10-CM

## 2019-04-13 DIAGNOSIS — O99891 Other specified diseases and conditions complicating pregnancy: Secondary | ICD-10-CM

## 2019-04-13 DIAGNOSIS — N949 Unspecified condition associated with female genital organs and menstrual cycle: Secondary | ICD-10-CM

## 2019-04-13 DIAGNOSIS — O09213 Supervision of pregnancy with history of pre-term labor, third trimester: Secondary | ICD-10-CM | POA: Diagnosis not present

## 2019-04-13 DIAGNOSIS — O365930 Maternal care for other known or suspected poor fetal growth, third trimester, not applicable or unspecified: Secondary | ICD-10-CM

## 2019-04-13 DIAGNOSIS — Z8751 Personal history of pre-term labor: Secondary | ICD-10-CM

## 2019-04-13 HISTORY — DX: Gestational diabetes mellitus in pregnancy, diet controlled: O24.410

## 2019-04-13 MED ORDER — COMFORT FIT MATERNITY SUPP MED MISC: 1.0000 [IU] | Freq: Every day | 0 refills | Status: DC

## 2019-04-13 NOTE — Progress Notes (Signed)
PRENATAL VISIT NOTE  Subjective:  Sierra Gamble is a 33 y.o. [id number] at 41w1dbeing seen today for ongoing prenatal care.  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  Patient reports left sided lower abdominal pain.  Contractions: Not present. Vag. Bleeding: None.  Movement: Present. Denies leaking of fluid.   The following portions of the patient's history were reviewed and updated as appropriate: allergies, current medications, past family history, past medical history, past social history, past surgical history and problem list.   Objective:   Vitals:   04/13/19 1125  BP: (!) 115/51  Pulse: 85  Weight: 151 lb 7.2 oz (68.7 kg)    Fetal Status: Fetal Heart Rate (bpm): 129   Movement: Present     General:  Alert, oriented and cooperative. Patient is in no acute distress.  Skin: Skin is warm and dry. No rash noted.   Cardiovascular: Normal heart rate noted  Respiratory: Normal respiratory effort, no problems with respiration noted  Abdomen: Soft, gravid, appropriate for gestational age.  Pain/Pressure: Present     Pelvic: Cervical exam deferred        Extremities: Normal range of motion.  Edema: None  Mental Status: Normal mood and affect. Normal behavior. Normal judgment and thought content.   Assessment and Plan:  Pregnancy: [id number] 351w1d. Supervision of high risk pregnancy, antepartum - Doing well, only occasional pain depending on activity   2. History of preterm delivery - 17-P today and weekly   3. Rubella non-immune status, antepartum - PP MMR  planned   4. Diet controlled gestational diabetes mellitus (GDM) in third trimester - Patient had Diabetes Education and has been checking CBGs for almost a week  - Fasting values are 70-80s - She is not consistently checking PP CBGs, at least half of those she is checking are elevated - She is keeping good notes of what she is eating and it was discussed that carbohydrates in noodles and rise will cause rise in sugar - Also discussed sugary drinks noted, sweet tea, coke, frappacino  - She admits she has not made any changes to her diet at this time. Dietary changes were encouraged to avoid the need for medications including insulin and to avoid complications with the pregnancy including fetal demise  5. Anemia complicating pregnancy, unspecified trimester - Scheduled for [place] on Monday   6. Poor fetal growth affecting management of mother in third trimester, single or unspecified fetus - [place]0/7 EFW 6% - Follow-up [place] for 10/21  8. 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 Round ligament pain - Elastic Bandages & Supports (COMFORT FIT MATERNITY SUPP MED) MISC; 1 Units by Does not apply route daily.  Dispense: 1 each; Refill: 0  Preterm labor symptoms and general obstetric precautions including but not limited to vaginal bleeding, contractions, leaking of fluid and fetal movement were reviewed in detail with the patient. Please refer to After Visit Summary for other counseling recommendations.   Return in about 2 weeks (around 04/27/2019) for [facility].  Future Appointments  Date Time Provider DeRidgely10/19/2020  9:00 AM [facility] ROOM 3 [facility] None  04/19/2019  2:15 PM [place]  NST [place] [facility]-US  04/19/2019  3:15 PM [place] NURSE [place] MFC-US  04/19/2019  3:15 PM [place] 4 WH-M[facility]US MFC-US  04/20/2019 10:00 AM [place]  04/27/2019  1:15 PM WH-MFC NST [place] MFC-US  04/27/2019  1:45 PM [place] NURSE [place] MFC-US  04/27/2019  1:45 PM  [place] 2 WH-MFCUS MFC-US  04/27/2019  4:15 PM [place], Audrie Lia, MD [facility]    Kerry Hough, PA-C

## 2019-04-13 NOTE — Patient Instructions (Addendum)
Fetal Movement Counts Patient Name: ________________________________________________ Patient Due Date: ____________________ What is a fetal movement count?  A fetal movement count is the number of times that you feel your baby move during a certain amount of time. This may also be called a fetal kick count. A fetal movement count is recommended for every pregnant woman. You may be asked to start counting fetal movements as early as week 28 of your pregnancy. Pay attention to when your baby is most active. You may notice your baby's sleep and wake cycles. You may also notice things that make your baby move more. You should do a fetal movement count:  When your baby is normally most active.  At the same time each day. A good time to count movements is while you are resting, after having something to eat and drink. How do I count fetal movements? 1. Find a quiet, comfortable area. Sit, or lie down on your side. 2. Write down the date, the start time and stop time, and the number of movements that you felt between those two times. Take this information with you to your health care visits. 3. For 2 hours, count kicks, flutters, swishes, rolls, and jabs. You should feel at least 10 movements during 2 hours. 4. You may stop counting after you have felt 10 movements. 5. If you do not feel 10 movements in 2 hours, have something to eat and drink. Then, keep resting and counting for 1 hour. If you feel at least 4 movements during that hour, you may stop counting. Contact a health care provider if:  You feel fewer than 4 movements in 2 hours.  Your baby is not moving like he or she usually does. Date: ____________ Start time: ____________ Stop time: ____________ Movements: ____________ Date: ____________ Start time: ____________ Stop time: ____________ Movements: ____________ Date: ____________ Start time: ____________ Stop time: ____________ Movements: ____________ Date: ____________ Start time:  ____________ Stop time: ____________ Movements: ____________ Date: ____________ Start time: ____________ Stop time: ____________ Movements: ____________ Date: ____________ Start time: ____________ Stop time: ____________ Movements: ____________ Date: ____________ Start time: ____________ Stop time: ____________ Movements: ____________ Date: ____________ Start time: ____________ Stop time: ____________ Movements: ____________ Date: ____________ Start time: ____________ Stop time: ____________ Movements: ____________ This information is not intended to replace advice given to you by your health care provider. Make sure you discuss any questions you have with your health care provider. Document Released: 07/15/2006 Document Revised: 07/05/2018 Document Reviewed: 07/25/2015 Elsevier Patient Education  2020 [employer] Braxton Hicks Contractions Contractions of the uterus can occur throughout pregnancy, but they are not always a sign that you are in labor. You may have practice contractions called Braxton Hicks contractions. These false labor contractions are sometimes confused with true labor. What are Braxton Hicks contractions? Braxton Hicks contractions are tightening movements that occur in the muscles of the uterus before labor. Unlike true labor contractions, these contractions do not result in opening (dilation) and thinning of the cervix. Toward the end of pregnancy (32-34 weeks), Braxton Hicks contractions can happen more often and may become stronger. These contractions are sometimes difficult to tell apart from true labor because they can be very uncomfortable. You should not feel embarrassed if you go to the hospital with false labor. Sometimes, the only way to tell if you are in true labor is for your health care provider to look for changes in the cervix. The health care provider will do a physical exam and may monitor your contractions. If you   are not in true labor, the exam should show  that your cervix is not dilating and your water has not broken. If there are no other health problems associated with your pregnancy, it is completely safe for you to be sent home with false labor. You may continue to have Braxton Hicks contractions until you go into true labor. How to tell the difference between true labor and false labor True labor  Contractions last 30-70 seconds.  Contractions become very regular.  Discomfort is usually felt in the top of the uterus, and it spreads to the lower abdomen and low back.  Contractions do not go away with walking.  Contractions usually become more intense and increase in frequency.  The cervix dilates and gets thinner. False labor  Contractions are usually shorter and not as strong as true labor contractions.  Contractions are usually irregular.  Contractions are often felt in the front of the lower abdomen and in the groin.  Contractions may go away when you walk around or change positions while lying down.  Contractions get weaker and are shorter-lasting as time goes on.  The cervix usually does not dilate or become thin. Follow these instructions at home:   Take over-the-counter and prescription medicines only as told by your health care provider.  Keep up with your usual exercises and follow other instructions from your health care provider.  Eat and drink lightly if you think you are going into labor.  If Braxton Hicks contractions are making you uncomfortable: ? Change your position from lying down or resting to walking, or change from walking to resting. ? Sit and rest in a tub of warm water. ? Drink enough fluid to keep your urine pale yellow. Dehydration may cause these contractions. ? Do slow and deep breathing several times an hour.  Keep all follow-up prenatal visits as told by your health care provider. This is important. Contact a health care provider if:  You have a fever.  You have continuous pain in  your abdomen. Get help right away if:  Your contractions become stronger, more regular, and closer together.  You have fluid leaking or gushing from your vagina.  You pass blood-tinged mucus (bloody show).  You have bleeding from your vagina.  You have low back pain that you never had before.  You feel your baby's head pushing down and causing pelvic pressure.  Your baby is not moving inside you as much as it used to. Summary  Contractions that occur before labor are called Braxton Hicks contractions, false labor, or practice contractions.  Braxton Hicks contractions are usually shorter, weaker, farther apart, and less regular than true labor contractions. True labor contractions usually become progressively stronger and regular, and they become more frequent.  Manage discomfort from [facility] contractions by changing position, resting in a warm bath, drinking plenty of water, or practicing deep breathing. This information is not intended to replace advice given to you by your health care provider. Make sure you discuss any questions you have with your health care provider. Document Released: 10/29/2016 Document Revised: 05/28/2017 Document Reviewed: 10/29/2016 Elsevier Patient Education  2020 [address], [employer] [employer], [place] [address] Phone: [phone]  Monday     8:30AM-5PM Tuesday 8:30AM-5PM Wednesday 8:30AM-5PM Thursday 8:30AM-5PM Friday  8:30AM-5PM Saturday Closed Sunday Closed   Contraception Choices Contraception, also called birth control, means things to use or ways to try not to get pregnant. Hormonal birth  control This kind of birth control uses hormones. Here are some types of hormonal birth control:  A tube that is put under skin of the arm (implant). The tube can stay in for as long as 3 years.  Shots to get every 3 months (injections).  Pills to take every day (birth control pills).  A patch  to change 1 time each week for 3 weeks (birth control patch). After that, the patch is taken off for 1 week.  A ring to put in the vagina. The ring is left in for 3 weeks. Then it is taken out of the vagina for 1 week. Then a new ring is put in.  Pills to take after unprotected sex (emergency birth control pills). Barrier birth control Here are some types of barrier birth control:  A thin covering that is put on the penis before sex (female condom). The covering is thrown away after sex.  A soft, loose covering that is put in the vagina before sex (female condom). The covering is thrown away after sex.  A rubber bowl that sits over the cervix (diaphragm). The bowl must be made for you. The bowl is put into the vagina before sex. The bowl is left in for 6-8 hours after sex. It is taken out within 24 hours.  A small, soft cup that fits over the cervix (cervical cap). The cup must be made for you. The cup can be left in for 6-8 hours after sex. It is taken out within 48 hours.  A sponge that is put into the vagina before sex. It must be left in for at least 6 hours after sex. It must be taken out within 30 hours. Then it is thrown away.  A chemical that kills or stops sperm from getting into the uterus (spermicide). It may be a pill, cream, jelly, or foam to put in the vagina. The chemical should be used at least 10-15 minutes before sex. IUD (intrauterine) birth control An IUD is a small, T-shaped piece of plastic. It is put inside the uterus. There are two kinds:  Hormone IUD. This kind can stay in for 3-5 years.  Copper IUD. This kind can stay in for 10 years. Permanent birth control Here are some types of permanent birth control:  Surgery to block the fallopian tubes.  Having an insert put into each fallopian tube.  Surgery to tie off the tubes that carry sperm (vasectomy). Natural planning birth control Here are some types of natural planning birth control:  Not having sex on the  days the woman could get pregnant.  Using a calendar: ? To keep track of the length of each period. ? To find out what days pregnancy can happen. ? To plan to not have sex on days when pregnancy can happen.  Watching for symptoms of ovulation and not having sex during ovulation. One way the woman can check for ovulation is to check her temperature.  Waiting to have sex until after ovulation. Summary  Contraception, also called birth control, means things to use or ways to try not to get pregnant.  Hormonal methods of birth control include implants, injections, pills, patches, vaginal rings, and emergency birth control pills.  Barrier methods of birth control can include female condoms, female condoms, diaphragms, cervical caps, sponges, and spermicides.  There are two types of IUD (intrauterine device) birth control. An IUD can be put in a woman's uterus to prevent pregnancy for 3-5 years.  Permanent sterilization can be  done through a procedure for males, females, or both.  Natural planning methods involve not having sex on the days when the woman could get pregnant. This information is not intended to replace advice given to you by your health care provider. Make sure you discuss any questions you have with your health care provider. Document Released: 04/12/2009 Document Revised: 10/05/2018 Document Reviewed: 06/25/2016 Elsevier Patient Education  2020 [employer].

## 2019-04-17 ENCOUNTER — Encounter (HOSPITAL_COMMUNITY)
Admission: RE | Admit: 2019-04-17 | Discharge: 2019-04-17 | Disposition: A | Discharge status: Home / Self Care | Payer: Managed Care, Other (non HMO) | Source: Ambulatory Visit | Attending: Obstetrics and Gynecology | Admitting: Obstetrics and Gynecology

## 2019-04-17 ENCOUNTER — Other Ambulatory Visit: Payer: Self-pay

## 2019-04-17 DIAGNOSIS — O99013 Anemia complicating pregnancy, third trimester: Secondary | ICD-10-CM | POA: Insufficient documentation

## 2019-04-17 DIAGNOSIS — Z3A00 Weeks of gestation of pregnancy not specified: Secondary | ICD-10-CM | POA: Diagnosis not present

## 2019-04-17 DIAGNOSIS — D649 Anemia, unspecified: Secondary | ICD-10-CM | POA: Insufficient documentation

## 2019-04-17 MED ORDER — SODIUM CHLORIDE 0.9 % IV SOLN
510.0000 mg | INTRAVENOUS | Status: DC
  Administered 2019-04-17: 510 mg via INTRAVENOUS
  Filled 2019-04-17: qty 17

## 2019-04-17 NOTE — Discharge Instructions (Signed)

## 2019-04-19 ENCOUNTER — Ambulatory Visit (HOSPITAL_COMMUNITY): Payer: Managed Care, Other (non HMO) | Admitting: *Deleted

## 2019-04-19 ENCOUNTER — Ambulatory Visit (HOSPITAL_COMMUNITY): Payer: Managed Care, Other (non HMO)

## 2019-04-19 ENCOUNTER — Encounter: Payer: Self-pay | Admitting: Emergency Medicine

## 2019-04-19 ENCOUNTER — Other Ambulatory Visit: Payer: Self-pay

## 2019-04-19 ENCOUNTER — Ambulatory Visit (HOSPITAL_COMMUNITY)
Admission: RE | Admit: 2019-04-19 | Discharge: 2019-04-19 | Disposition: A | Discharge status: Home / Self Care | Payer: Managed Care, Other (non HMO) | Source: Ambulatory Visit | Attending: Obstetrics and Gynecology | Admitting: Obstetrics and Gynecology

## 2019-04-19 ENCOUNTER — Encounter (HOSPITAL_COMMUNITY): Payer: Self-pay

## 2019-04-19 ENCOUNTER — Ambulatory Visit (INDEPENDENT_AMBULATORY_CARE_PROVIDER_SITE_OTHER): Payer: Managed Care, Other (non HMO)

## 2019-04-19 VITALS — Wt 152.0 lb

## 2019-04-19 VITALS — BP 99/65 | HR 77 | Temp 98.1°F

## 2019-04-19 DIAGNOSIS — Z3A31 31 weeks gestation of pregnancy: Secondary | ICD-10-CM | POA: Diagnosis not present

## 2019-04-19 DIAGNOSIS — O09213 Supervision of pregnancy with history of pre-term labor, third trimester: Secondary | ICD-10-CM

## 2019-04-19 DIAGNOSIS — Z8751 Personal history of pre-term labor: Secondary | ICD-10-CM

## 2019-04-19 DIAGNOSIS — O365990 Maternal care for other known or suspected poor fetal growth, unspecified trimester, not applicable or unspecified: Secondary | ICD-10-CM

## 2019-04-19 DIAGNOSIS — O24410 Gestational diabetes mellitus in pregnancy, diet controlled: Secondary | ICD-10-CM | POA: Diagnosis not present

## 2019-04-19 DIAGNOSIS — O99013 Anemia complicating pregnancy, third trimester: Secondary | ICD-10-CM

## 2019-04-19 DIAGNOSIS — Z148 Genetic carrier of other disease: Secondary | ICD-10-CM | POA: Diagnosis not present

## 2019-04-19 MED ORDER — HYDROXYPROGESTERONE CAPROATE 250 MG/ML IM OIL: 250.0000 mg | TOPICAL_OIL | Freq: Once | INTRAMUSCULAR | Status: DC

## 2019-04-19 NOTE — Procedures (Signed)
Sierra Gamble Aug 02, 1985 [redacted]w[redacted]d  Fetus A Non-Stress Test Interpretation for 04/19/19  Indication: IUGR  Fetal Heart Rate A Mode: External Baseline Rate (A): 135 bpm Variability: Moderate Accelerations: 10 x 10, 15 x 15 Decelerations: None Multiple birth?: No  Uterine Activity Mode: Palpation, Toco Contraction Frequency (min): none Resting Tone Palpated: Relaxed Resting Time: Adequate  Interpretation (Fetal Testing) Nonstress Test Interpretation: Reactive Comments: Reviewed tracing with Dr. Donalee Citrin

## 2019-04-19 NOTE — Addendum Note (Signed)
Addended by: Alric Seton on: 04/19/2019 02:31 PM   Modules accepted: Orders

## 2019-04-19 NOTE — Progress Notes (Signed)
Cheronda Arthurs here for 17-P  Injection.  Injection administered without complication. Patient will return in one week for next injection.  Alric Seton, [place] 04/19/2019  2:24 PM

## 2019-04-20 ENCOUNTER — Ambulatory Visit: Payer: Managed Care, Other (non HMO)

## 2019-04-22 ENCOUNTER — Encounter: Payer: Self-pay | Admitting: Family Medicine

## 2019-04-24 ENCOUNTER — Encounter (HOSPITAL_COMMUNITY)
Admission: RE | Admit: 2019-04-24 | Discharge: 2019-04-24 | Disposition: A | Discharge status: Home / Self Care | Payer: Managed Care, Other (non HMO) | Source: Ambulatory Visit | Attending: Obstetrics and Gynecology | Admitting: Obstetrics and Gynecology

## 2019-04-24 ENCOUNTER — Other Ambulatory Visit: Payer: Self-pay

## 2019-04-24 DIAGNOSIS — O99013 Anemia complicating pregnancy, third trimester: Secondary | ICD-10-CM | POA: Diagnosis not present

## 2019-04-24 MED ORDER — SODIUM CHLORIDE 0.9 % IV SOLN
510.0000 mg | INTRAVENOUS | Status: DC
  Administered 2019-04-24: 510 mg via INTRAVENOUS
  Filled 2019-04-24: qty 17

## 2019-04-27 ENCOUNTER — Ambulatory Visit (HOSPITAL_COMMUNITY): Payer: Managed Care, Other (non HMO) | Admitting: *Deleted

## 2019-04-27 ENCOUNTER — Ambulatory Visit (HOSPITAL_COMMUNITY)
Admission: RE | Admit: 2019-04-27 | Discharge: 2019-04-27 | Disposition: A | Discharge status: Home / Self Care | Payer: Managed Care, Other (non HMO) | Source: Ambulatory Visit | Attending: Obstetrics and Gynecology | Admitting: Obstetrics and Gynecology

## 2019-04-27 ENCOUNTER — Other Ambulatory Visit: Payer: Self-pay

## 2019-04-27 ENCOUNTER — Encounter (HOSPITAL_COMMUNITY): Payer: Self-pay | Admitting: *Deleted

## 2019-04-27 ENCOUNTER — Encounter: Payer: Managed Care, Other (non HMO) | Admitting: Obstetrics and Gynecology

## 2019-04-27 VITALS — BP 103/50 | HR 78 | Temp 98.3°F

## 2019-04-27 DIAGNOSIS — Z3A32 32 weeks gestation of pregnancy: Secondary | ICD-10-CM | POA: Diagnosis not present

## 2019-04-27 DIAGNOSIS — O365990 Maternal care for other known or suspected poor fetal growth, unspecified trimester, not applicable or unspecified: Secondary | ICD-10-CM

## 2019-04-27 DIAGNOSIS — O24410 Gestational diabetes mellitus in pregnancy, diet controlled: Secondary | ICD-10-CM | POA: Insufficient documentation

## 2019-04-27 NOTE — Procedures (Signed)
Sierra Gamble [birth year] [redacted]w[redacted]d  Fetus A Non-Stress Test Interpretation for 04/27/19  Indication: IUGR  Fetal Heart Rate A Mode: External Baseline Rate (A): 130 bpm Variability: Moderate Accelerations: 15 x 15 Decelerations: None Multiple birth?: No  Uterine Activity Mode: Palpation, Toco Contraction Frequency (min): Erratic UI Contraction Quality: Mild Resting Tone Palpated: Relaxed Resting Time: Adequate  Interpretation (Fetal Testing) Nonstress Test Interpretation: Reactive Comments: EFM tracing reviewed by Dr. Donalee Citrin

## 2019-04-28 ENCOUNTER — Other Ambulatory Visit (HOSPITAL_COMMUNITY): Payer: Self-pay | Admitting: *Deleted

## 2019-04-28 ENCOUNTER — Ambulatory Visit (INDEPENDENT_AMBULATORY_CARE_PROVIDER_SITE_OTHER): Payer: Managed Care, Other (non HMO) | Admitting: Obstetrics & Gynecology

## 2019-04-28 VITALS — BP 105/73 | HR 80 | Wt 154.0 lb

## 2019-04-28 DIAGNOSIS — O0990 Supervision of high risk pregnancy, unspecified, unspecified trimester: Secondary | ICD-10-CM

## 2019-04-28 DIAGNOSIS — Z3A32 32 weeks gestation of pregnancy: Secondary | ICD-10-CM

## 2019-04-28 DIAGNOSIS — O365930 Maternal care for other known or suspected poor fetal growth, third trimester, not applicable or unspecified: Secondary | ICD-10-CM

## 2019-04-28 DIAGNOSIS — O09213 Supervision of pregnancy with history of pre-term labor, third trimester: Secondary | ICD-10-CM

## 2019-04-28 DIAGNOSIS — O24410 Gestational diabetes mellitus in pregnancy, diet controlled: Secondary | ICD-10-CM

## 2019-04-28 DIAGNOSIS — O0993 Supervision of high risk pregnancy, unspecified, third trimester: Secondary | ICD-10-CM

## 2019-04-28 NOTE — Progress Notes (Signed)
   PRENATAL VISIT NOTE  Subjective:  Sierra Gamble is a 33 y.o. [id number] at [redacted]w[redacted]d being seen today for ongoing prenatal care.  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  Patient reports no complaints.  Contractions: Not present. Vag. Bleeding: None.  Movement: (!) Decreased. Denies leaking of fluid.   The following portions of the patient's history were reviewed and updated as appropriate: allergies, current medications, past family history, past medical history, past social history, past surgical history and problem list.   Objective:   Vitals:   04/28/19 0935  BP: 105/73  Pulse: 80  Weight: 154 lb (69.9 kg)    Fetal Status: Fetal Heart Rate (bpm): 142   Movement: (!) Decreased     General:  Alert, oriented and cooperative. Patient is in no acute distress.  Skin: Skin is warm and dry. No rash noted.   Cardiovascular: Normal heart rate noted  Respiratory: Normal respiratory effort, no problems with respiration noted  Abdomen: Soft, gravid, appropriate for gestational age.  Pain/Pressure: Absent     Pelvic: Cervical exam deferred        Extremities: Normal range of motion.  Edema: None  Mental Status: Normal mood and affect. Normal behavior. Normal judgment and thought content.   Assessment and Plan:  Pregnancy: [id number] at [redacted]w[redacted]d 1. GDM (gestational diabetes mellitus), class A1 Good BG control on diet  2. Poor fetal growth affecting management of mother in third trimester, single or unspecified fetus F/u US reviewed 10/29  3. Supervision of high risk pregnancy,  antepartum   Preterm labor symptoms and general obstetric precautions including but not limited to vaginal bleeding, contractions, leaking of fluid and fetal movement were reviewed in detail with the patient. Please refer to After Visit Summary for other counseling recommendations.   Return in about 2 weeks (around 05/12/2019).  Future Appointments  Date Time Provider [place]  05/05/2019  2:15 PM [place] NST [place] MFC-US  05/05/2019  2:45 PM [place] NURSE [place] MFC-US  05/05/2019  2:45 PM [place] 2 WH-MFCUS MFC-US  05/12/2019  2:15 PM [place] NST [place] MFC-US  05/12/2019  3:00 PM [place] NURSE [place] MFC-US  05/12/2019  3:00 PM [place] 3 WH-MFCUS MFC-US  05/19/2019  2:15 PM [place] NST [place] MFC-US  05/19/2019  3:00 PM [place] NURSE [place] MFC-US  05/19/2019  3:00 PM [place] 3 WH-MFCUS MFC-US    Emeterio Reeve, MD

## 2019-04-28 NOTE — Patient Instructions (Signed)

## 2019-05-05 ENCOUNTER — Encounter (HOSPITAL_COMMUNITY): Payer: Self-pay

## 2019-05-05 ENCOUNTER — Other Ambulatory Visit: Payer: Self-pay

## 2019-05-05 ENCOUNTER — Ambulatory Visit (HOSPITAL_COMMUNITY): Payer: Managed Care, Other (non HMO) | Admitting: *Deleted

## 2019-05-05 ENCOUNTER — Ambulatory Visit (HOSPITAL_COMMUNITY)
Admission: RE | Admit: 2019-05-05 | Discharge: 2019-05-05 | Disposition: A | Discharge status: Home / Self Care | Payer: Managed Care, Other (non HMO) | Source: Ambulatory Visit | Attending: Obstetrics and Gynecology | Admitting: Obstetrics and Gynecology

## 2019-05-05 ENCOUNTER — Ambulatory Visit (INDEPENDENT_AMBULATORY_CARE_PROVIDER_SITE_OTHER): Payer: Managed Care, Other (non HMO) | Admitting: Lactation Services

## 2019-05-05 VITALS — BP 112/71 | HR 93 | Temp 98.3°F

## 2019-05-05 DIAGNOSIS — O09213 Supervision of pregnancy with history of pre-term labor, third trimester: Secondary | ICD-10-CM | POA: Diagnosis not present

## 2019-05-05 DIAGNOSIS — O365990 Maternal care for other known or suspected poor fetal growth, unspecified trimester, not applicable or unspecified: Secondary | ICD-10-CM

## 2019-05-05 DIAGNOSIS — Z3A34 34 weeks gestation of pregnancy: Secondary | ICD-10-CM

## 2019-05-05 DIAGNOSIS — O365930 Maternal care for other known or suspected poor fetal growth, third trimester, not applicable or unspecified: Secondary | ICD-10-CM | POA: Insufficient documentation

## 2019-05-05 DIAGNOSIS — O365931 Maternal care for other known or suspected poor fetal growth, third trimester, fetus 1: Secondary | ICD-10-CM

## 2019-05-05 DIAGNOSIS — O24410 Gestational diabetes mellitus in pregnancy, diet controlled: Secondary | ICD-10-CM

## 2019-05-05 DIAGNOSIS — O0990 Supervision of high risk pregnancy, unspecified, unspecified trimester: Secondary | ICD-10-CM

## 2019-05-05 DIAGNOSIS — Z3A33 33 weeks gestation of pregnancy: Secondary | ICD-10-CM | POA: Diagnosis not present

## 2019-05-05 MED ORDER — HYDROXYPROGESTERONE CAPROATE 250 MG/ML IM OIL
250.0000 mg | TOPICAL_OIL | Freq: Once | INTRAMUSCULAR | Status: AC
  Administered 2019-05-05: 10:00:00 250 mg via INTRAMUSCULAR

## 2019-05-05 NOTE — Progress Notes (Signed)
Sierra Gamble here for 17-P  Injection.  Injection administered without complication. Patient will return in one week for next injection. Pt tolerated well.    Pt report no contractions, bleeding or cramping. She reports increased secretions last night after intercourse, discussed secretions can increase as pregnancy progresses. Enc her to pay attention to if leaking or trickling continues. Pt voiced understanding.   Pt to follow up in 1 week.   Donn Pierini, RN 05/05/2019  10:25 AM

## 2019-05-06 ENCOUNTER — Encounter: Payer: Self-pay | Admitting: Family Medicine

## 2019-05-06 IMAGING — US US OB < 14 WEEKS - US OB TV
1 series · 15 of 28 positions shown · non-contrast
Comparison: None.

CLINICAL DATA: Vaginal bleeding.  Pregnant patient.

EXAM:
OBSTETRIC <14 WK US AND TRANSVAGINAL OB US
TECHNIQUE: Both transabdominal and transvaginal ultrasound examinations were
performed for complete evaluation of the gestation as well as the
maternal uterus, adnexal regions, and pelvic cul-de-sac.
Transvaginal technique was performed to assess early pregnancy.

[Series 1: us ob < 14 weeks - us ob tv · 15 of 42 slices shown]
[im 1/42]
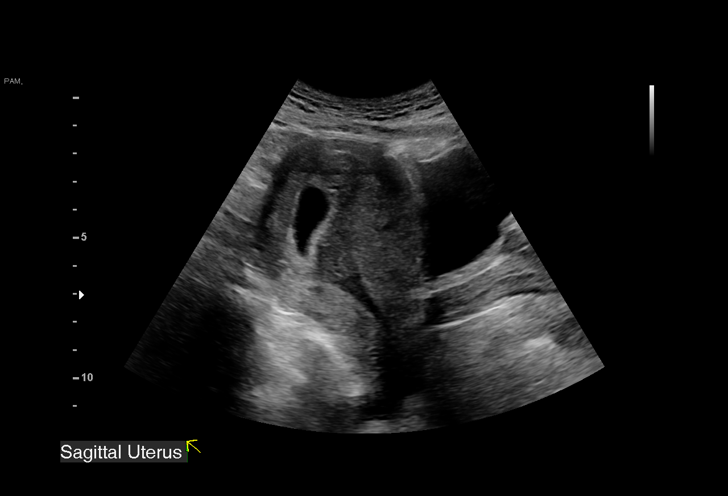
[im 4/42]
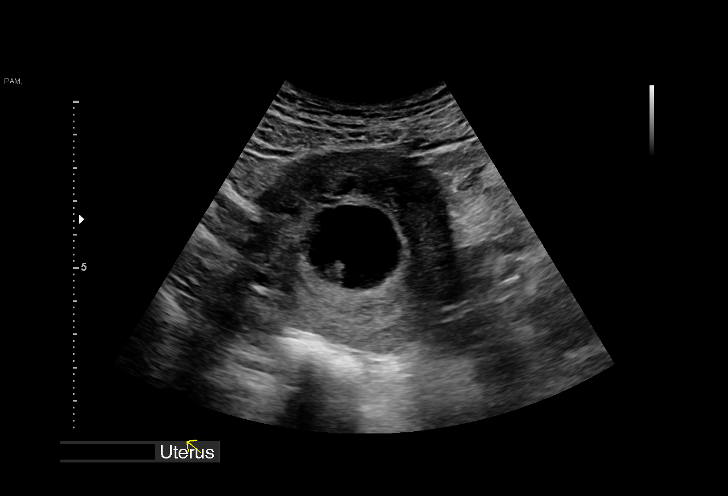
[im 7/42]
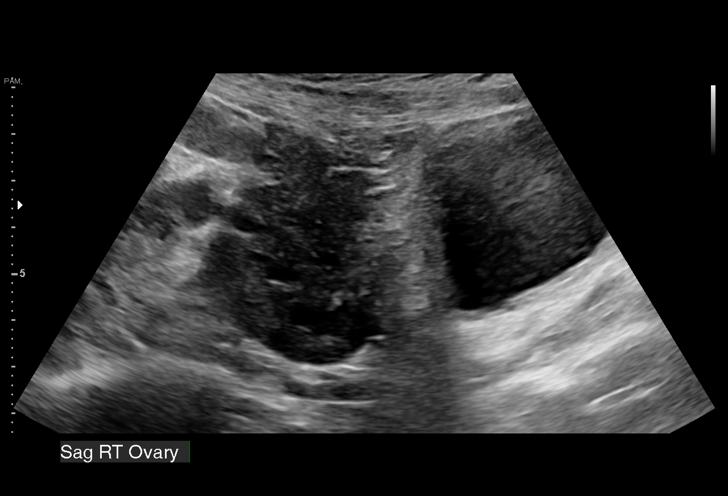
[im 10/42]
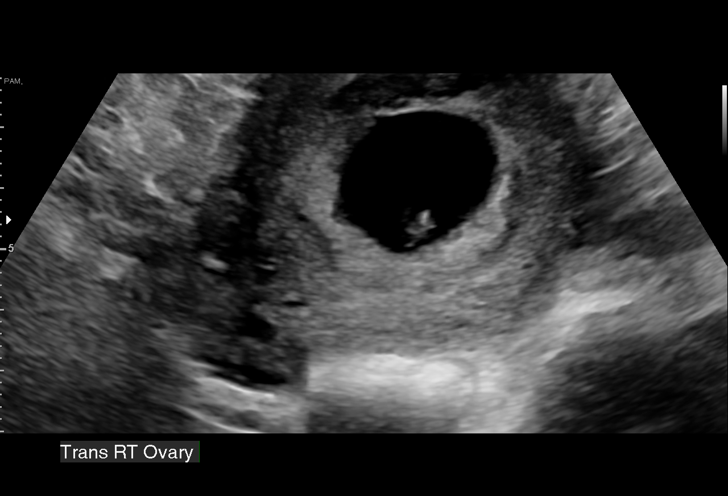
[im 13/42]
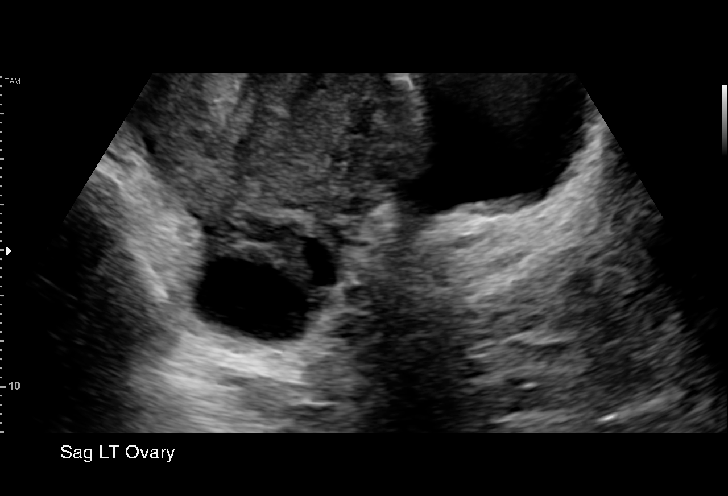
[im 16/42]
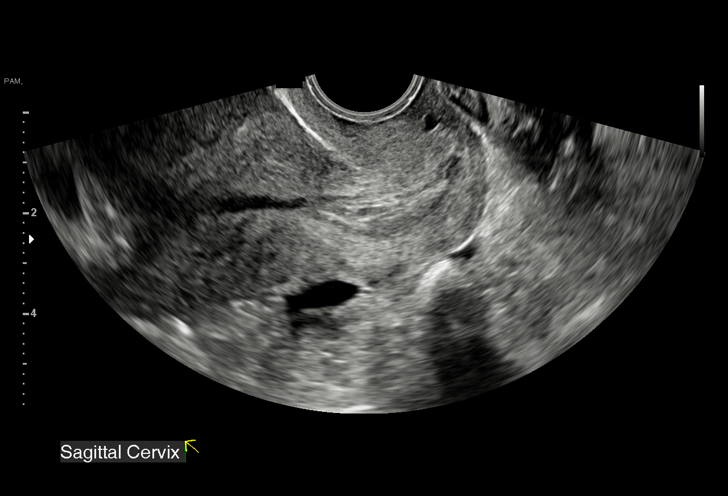
[im 19/42]
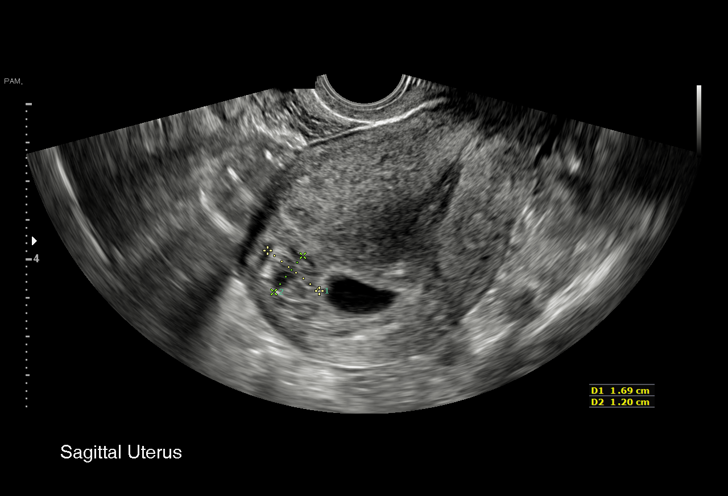
[im 22/42]
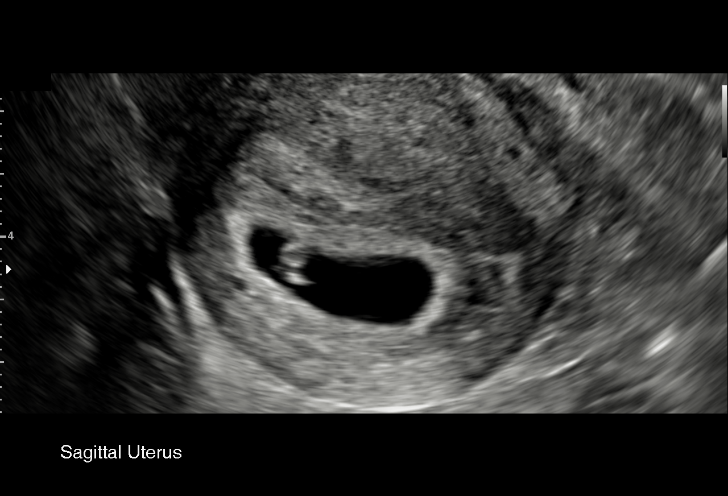
[im 23/42]
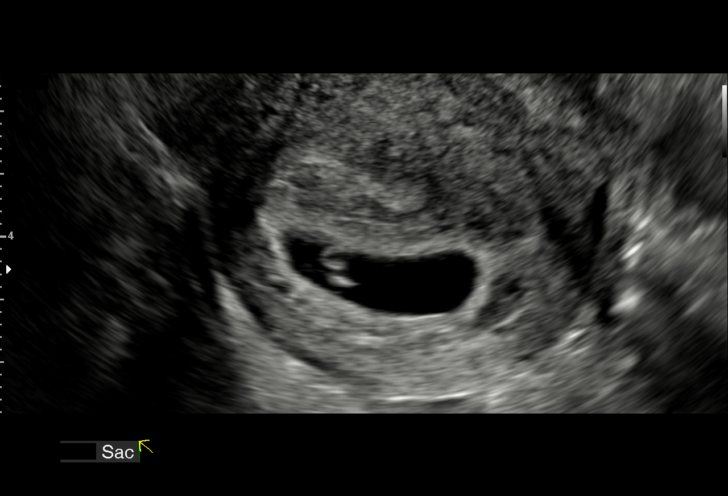
[im 26/42]
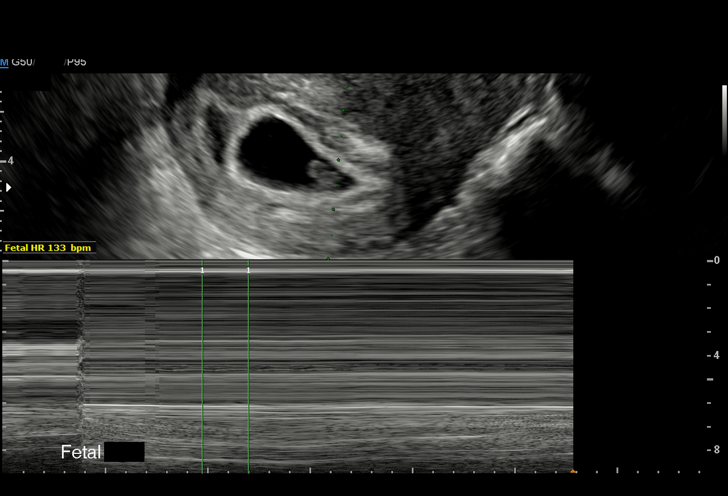
[im 29/42]
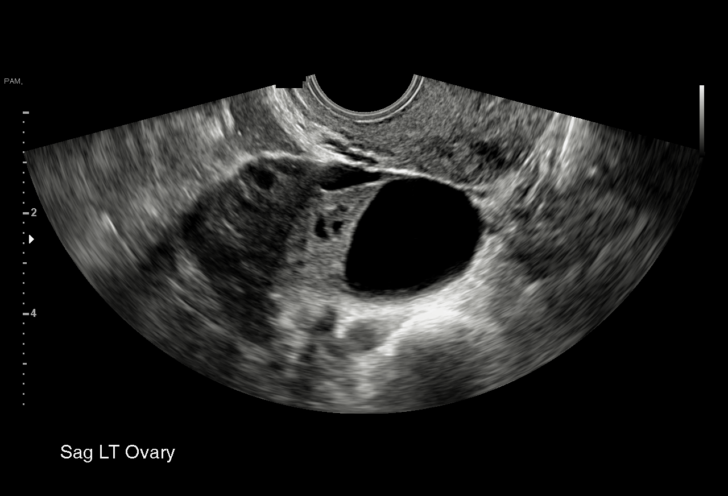
[im 32/42]
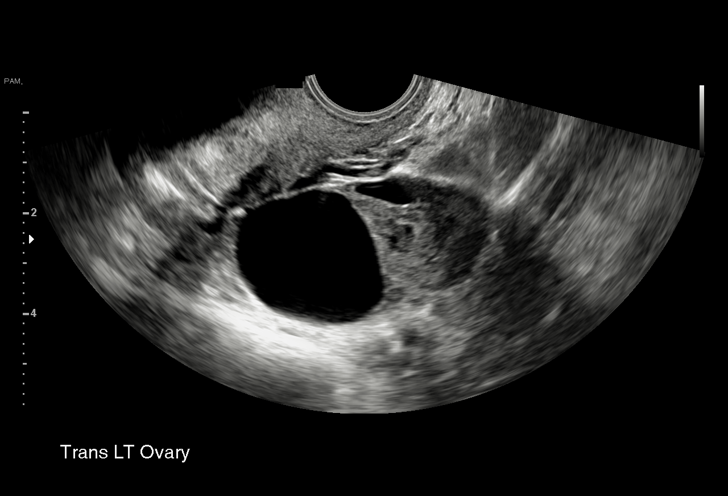
[im 35/42]
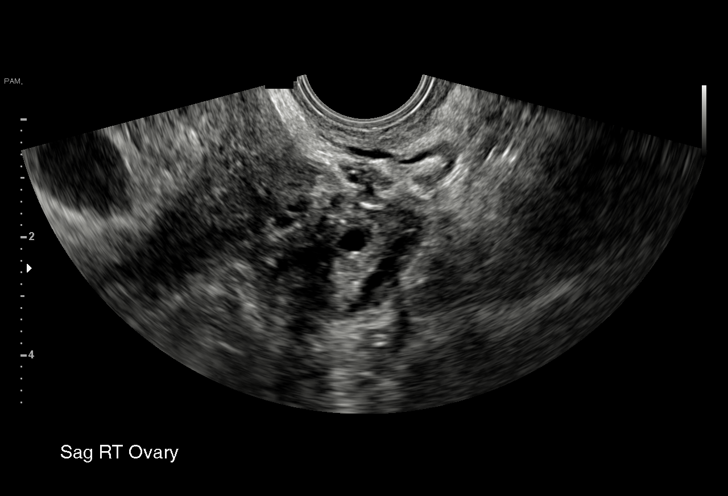
[im 38/42]
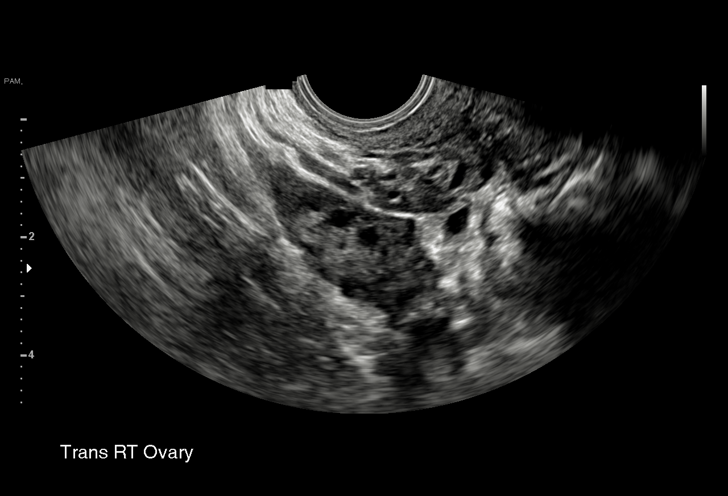
[im 42/42]
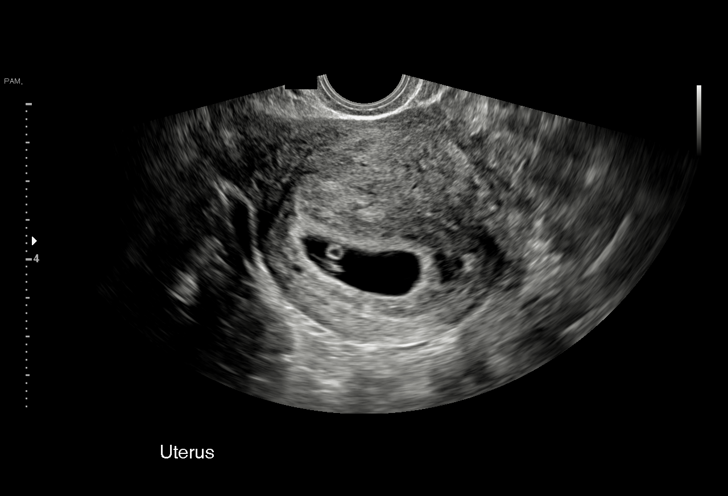

[15 of 28 positions shown; findings below may reference images not displayed]

FINDINGS: Intrauterine gestational sac: Single

Yolk sac:  Visualized.

Embryo:  Visualized.

Cardiac Activity: Visualized.

Heart Rate: 133  bpm

MSD:   mm    w     d

CRL:  8.9  mm   6 w   5 d                  US EDC: January 31, 2018

Subchorionic hemorrhage: There is a small subchorionic hemorrhage
measuring 17 x 12 mm

Maternal uterus/adnexae: Normal.  Corpus luteum cyst on the left.
IMPRESSION: 1. Single live IUP. Small subchorionic hemorrhage measuring 17 x 12
mm.

## 2019-05-12 ENCOUNTER — Ambulatory Visit (INDEPENDENT_AMBULATORY_CARE_PROVIDER_SITE_OTHER): Payer: Managed Care, Other (non HMO) | Admitting: Family Medicine

## 2019-05-12 ENCOUNTER — Other Ambulatory Visit (HOSPITAL_COMMUNITY): Payer: Self-pay | Admitting: *Deleted

## 2019-05-12 ENCOUNTER — Ambulatory Visit (HOSPITAL_COMMUNITY)
Admission: RE | Admit: 2019-05-12 | Discharge: 2019-05-12 | Disposition: A | Discharge status: Home / Self Care | Payer: Managed Care, Other (non HMO) | Source: Ambulatory Visit | Attending: Obstetrics and Gynecology | Admitting: Obstetrics and Gynecology

## 2019-05-12 ENCOUNTER — Ambulatory Visit (HOSPITAL_COMMUNITY): Payer: Managed Care, Other (non HMO) | Admitting: *Deleted

## 2019-05-12 ENCOUNTER — Encounter (HOSPITAL_COMMUNITY): Payer: Self-pay

## 2019-05-12 ENCOUNTER — Other Ambulatory Visit: Payer: Self-pay

## 2019-05-12 VITALS — BP 102/43 | HR 73 | Wt 154.0 lb

## 2019-05-12 VITALS — BP 109/61 | HR 76 | Temp 97.7°F

## 2019-05-12 DIAGNOSIS — O24410 Gestational diabetes mellitus in pregnancy, diet controlled: Secondary | ICD-10-CM

## 2019-05-12 DIAGNOSIS — O365930 Maternal care for other known or suspected poor fetal growth, third trimester, not applicable or unspecified: Secondary | ICD-10-CM | POA: Diagnosis present

## 2019-05-12 DIAGNOSIS — Z8751 Personal history of pre-term labor: Secondary | ICD-10-CM

## 2019-05-12 DIAGNOSIS — O365931 Maternal care for other known or suspected poor fetal growth, third trimester, fetus 1: Secondary | ICD-10-CM | POA: Diagnosis not present

## 2019-05-12 DIAGNOSIS — Z3A34 34 weeks gestation of pregnancy: Secondary | ICD-10-CM | POA: Diagnosis not present

## 2019-05-12 DIAGNOSIS — O0993 Supervision of high risk pregnancy, unspecified, third trimester: Secondary | ICD-10-CM

## 2019-05-12 DIAGNOSIS — O0990 Supervision of high risk pregnancy, unspecified, unspecified trimester: Secondary | ICD-10-CM

## 2019-05-12 NOTE — Progress Notes (Signed)
   PRENATAL VISIT NOTE  Subjective:  Sierra Gamble is a 33 y.o. [id number] at [redacted]w[redacted]d being seen today for ongoing prenatal care.  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  Patient reports no complaints.  Contractions: Not present. Vag. Bleeding: None.  Movement: Present. Denies leaking of fluid.   The following portions of the patient's history were reviewed and updated as appropriate: allergies, current medications, past family history, past medical history, past social history, past surgical history and problem list.   Objective:   Vitals:   05/12/19 1049  BP: (!) 102/43  Pulse: 73  Weight: 154 lb (69.9 kg)    Fetal Status: Fetal Heart Rate (bpm): 134 Fundal Height: 34 cm Movement: Present     General:  Alert, oriented and cooperative. Patient is in no acute distress.  Skin: Skin is warm and dry. No rash noted.   Cardiovascular: Normal heart rate noted  Respiratory: Normal respiratory effort, no problems with respiration noted  Abdomen: Soft, gravid, appropriate for gestational age.  Pain/Pressure: Absent     Pelvic: Cervical exam deferred        Extremities: Normal range of motion.  Edema: None  Mental Status: Normal mood and affect. Normal behavior. Normal judgment and thought content.   Assessment and Plan:  Pregnancy: [id number] at [redacted]w[redacted]d 1. Supervision of high risk pregnancy, antepartum FHT and FH normal  2. Poor fetal growth affecting management of mother in third trimester, single or unspecified fetus BPP and dopplers today  3. History of preterm delivery  4%.  Growth [place] on 11/20.  4. GDM (gestational diabetes mellitus), class A1 Controlled  5. Anemia in pregnancy S/p feraheme  Preterm labor symptoms and general obstetric precautions including but not limited to vaginal bleeding, contractions, leaking of fluid and fetal movement were reviewed in detail with the patient. Please refer to After Visit Summary for other counseling recommendations.   Return in about 2 weeks (around 05/26/2019) for HR OB f/u.  Future Appointments  Date Time Provider [place]  05/12/2019  2:15 PM [place] NST [place] [facility]  05/12/2019  3:00 PM [place] NURSE [place] [facility]  05/12/2019  3:00 PM [place] 3 WH-MFCUS M[facility]-US  05/19/2019  2:15 PM [place] NST [place] [facility]  05/19/2019  3:00 PM [place] NURSE [place] [facility]  05/19/2019  3:00 PM [place], DO

## 2019-05-12 NOTE — Procedures (Signed)
Annis Mayson 12-10-1985 [redacted]w[redacted]d  Fetus A Non-Stress Test Interpretation for 05/12/19  Indication: IUGR  Fetal Heart Rate A Mode: External Baseline Rate (A): 130 bpm Variability: Moderate Accelerations: 15 x 15 Decelerations: None Multiple birth?: No  Uterine Activity Mode: Toco Contraction Frequency (min): one UC noted Contraction Duration (sec): 110 Contraction Quality: Mild Resting Tone Palpated: Relaxed Resting Time: Adequate  Interpretation (Fetal Testing) Nonstress Test Interpretation: Reactive Comments: FHR tracing rev'd by Dr. Donalee Citrin

## 2019-05-18 ENCOUNTER — Telehealth: Payer: Self-pay | Admitting: Family Medicine

## 2019-05-18 NOTE — Telephone Encounter (Signed)
Spoke to patient about her appointment on 11/20 @ 9:00. Patient instructed to wear a face mask for the entire appointment and no visitors are allowed with her during the visit. Patient screened for covid symptoms and denied having any

## 2019-05-19 ENCOUNTER — Ambulatory Visit (HOSPITAL_COMMUNITY): Payer: Managed Care, Other (non HMO) | Admitting: *Deleted

## 2019-05-19 ENCOUNTER — Encounter (HOSPITAL_COMMUNITY): Payer: Self-pay

## 2019-05-19 ENCOUNTER — Other Ambulatory Visit: Payer: Self-pay

## 2019-05-19 ENCOUNTER — Ambulatory Visit (HOSPITAL_COMMUNITY)
Admission: RE | Admit: 2019-05-19 | Discharge: 2019-05-19 | Disposition: A | Discharge status: Home / Self Care | Payer: Managed Care, Other (non HMO) | Source: Ambulatory Visit | Attending: Obstetrics and Gynecology | Admitting: Obstetrics and Gynecology

## 2019-05-19 ENCOUNTER — Ambulatory Visit (INDEPENDENT_AMBULATORY_CARE_PROVIDER_SITE_OTHER): Payer: Managed Care, Other (non HMO)

## 2019-05-19 VITALS — BP 104/61 | HR 66 | Temp 97.7°F

## 2019-05-19 VITALS — BP 102/74 | HR 81 | Wt 156.4 lb

## 2019-05-19 DIAGNOSIS — O24410 Gestational diabetes mellitus in pregnancy, diet controlled: Secondary | ICD-10-CM

## 2019-05-19 DIAGNOSIS — O365990 Maternal care for other known or suspected poor fetal growth, unspecified trimester, not applicable or unspecified: Secondary | ICD-10-CM

## 2019-05-19 DIAGNOSIS — Z3A35 35 weeks gestation of pregnancy: Secondary | ICD-10-CM

## 2019-05-19 DIAGNOSIS — O0990 Supervision of high risk pregnancy, unspecified, unspecified trimester: Secondary | ICD-10-CM

## 2019-05-19 DIAGNOSIS — O365931 Maternal care for other known or suspected poor fetal growth, third trimester, fetus 1: Secondary | ICD-10-CM | POA: Diagnosis not present

## 2019-05-19 DIAGNOSIS — Z362 Encounter for other antenatal screening follow-up: Secondary | ICD-10-CM

## 2019-05-19 DIAGNOSIS — O09213 Supervision of pregnancy with history of pre-term labor, third trimester: Secondary | ICD-10-CM

## 2019-05-19 DIAGNOSIS — O99012 Anemia complicating pregnancy, second trimester: Secondary | ICD-10-CM | POA: Diagnosis not present

## 2019-05-19 DIAGNOSIS — O365930 Maternal care for other known or suspected poor fetal growth, third trimester, not applicable or unspecified: Secondary | ICD-10-CM | POA: Insufficient documentation

## 2019-05-19 DIAGNOSIS — Z8751 Personal history of pre-term labor: Secondary | ICD-10-CM

## 2019-05-19 MED ORDER — HYDROXYPROGESTERONE CAPROATE 250 MG/ML IM OIL
250.0000 mg | TOPICAL_OIL | Freq: Once | INTRAMUSCULAR | Status: AC
  Administered 2019-05-19: 10:00:00 250 mg via INTRAMUSCULAR

## 2019-05-19 NOTE — Progress Notes (Signed)
I have reviewed this chart and agree with the RN/CMA assessment and management.    K. Meryl Rosaire Cueto, M.D. Attending [facility] (Faculty Practice)   

## 2019-05-19 NOTE — Procedures (Signed)
Sierra Gamble 1985-10-09 [redacted]w[redacted]d  Fetus A Non-Stress Test Interpretation for 05/19/19  Indication: IUGR  Fetal Heart Rate A Mode: External Baseline Rate (A): 115 bpm Variability: Moderate Accelerations: 15 x 15 Decelerations: None Multiple birth?: No  Uterine Activity Mode: Toco Contraction Frequency (min): irreg UC noted Contraction Duration (sec): 40-170 Contraction Quality: Mild Resting Tone Palpated: Relaxed Resting Time: Adequate  Interpretation (Fetal Testing) Nonstress Test Interpretation: Reactive Comments: FHR tracing rev'd by Dr. Donalee Citrin

## 2019-05-19 NOTE — Progress Notes (Signed)
Sierra Gamble here for 17-P  Injection.  Injection administered without complication. Patient will return on 05/29/19 for next injection due to office being closed next Friday.  Annabell Howells, RN 05/19/2019  9:21 AM

## 2019-05-24 ENCOUNTER — Encounter (HOSPITAL_COMMUNITY): Payer: Self-pay

## 2019-05-24 ENCOUNTER — Ambulatory Visit (HOSPITAL_COMMUNITY): Payer: Managed Care, Other (non HMO) | Admitting: *Deleted

## 2019-05-24 ENCOUNTER — Other Ambulatory Visit: Payer: Self-pay

## 2019-05-24 ENCOUNTER — Ambulatory Visit (HOSPITAL_COMMUNITY)
Admission: RE | Admit: 2019-05-24 | Discharge: 2019-05-24 | Disposition: A | Discharge status: Home / Self Care | Payer: Managed Care, Other (non HMO) | Source: Ambulatory Visit | Attending: Obstetrics and Gynecology | Admitting: Obstetrics and Gynecology

## 2019-05-24 VITALS — BP 109/57 | HR 70 | Temp 97.7°F

## 2019-05-24 DIAGNOSIS — O365990 Maternal care for other known or suspected poor fetal growth, unspecified trimester, not applicable or unspecified: Secondary | ICD-10-CM

## 2019-05-24 DIAGNOSIS — O24410 Gestational diabetes mellitus in pregnancy, diet controlled: Secondary | ICD-10-CM | POA: Diagnosis not present

## 2019-05-24 DIAGNOSIS — Z3A36 36 weeks gestation of pregnancy: Secondary | ICD-10-CM

## 2019-05-24 DIAGNOSIS — O09213 Supervision of pregnancy with history of pre-term labor, third trimester: Secondary | ICD-10-CM | POA: Diagnosis not present

## 2019-05-24 DIAGNOSIS — O365931 Maternal care for other known or suspected poor fetal growth, third trimester, fetus 1: Secondary | ICD-10-CM | POA: Diagnosis not present

## 2019-05-24 DIAGNOSIS — O99013 Anemia complicating pregnancy, third trimester: Secondary | ICD-10-CM

## 2019-05-29 ENCOUNTER — Other Ambulatory Visit (HOSPITAL_COMMUNITY): Payer: Self-pay | Admitting: *Deleted

## 2019-05-29 ENCOUNTER — Ambulatory Visit (INDEPENDENT_AMBULATORY_CARE_PROVIDER_SITE_OTHER): Payer: Managed Care, Other (non HMO)

## 2019-05-29 ENCOUNTER — Other Ambulatory Visit: Payer: Self-pay

## 2019-05-29 VITALS — BP 118/82 | HR 68 | Wt 158.3 lb

## 2019-05-29 DIAGNOSIS — O09213 Supervision of pregnancy with history of pre-term labor, third trimester: Secondary | ICD-10-CM

## 2019-05-29 DIAGNOSIS — Z3A36 36 weeks gestation of pregnancy: Secondary | ICD-10-CM

## 2019-05-29 DIAGNOSIS — Z8751 Personal history of pre-term labor: Secondary | ICD-10-CM

## 2019-05-29 DIAGNOSIS — O24410 Gestational diabetes mellitus in pregnancy, diet controlled: Secondary | ICD-10-CM

## 2019-05-29 MED ORDER — HYDROXYPROGESTERONE CAPROATE 250 MG/ML IM OIL
250.0000 mg | TOPICAL_OIL | Freq: Once | INTRAMUSCULAR | Status: AC
  Administered 2019-05-29: 250 mg via INTRAMUSCULAR

## 2019-05-29 NOTE — Progress Notes (Signed)
Latosha Malesky here for 17-P  Injection.  Injection administered without complication. Patient will return on 06/01/19 for routine ob appt.    Verdell Carmine, RN 05/29/2019  9:27 AM

## 2019-05-31 ENCOUNTER — Ambulatory Visit (HOSPITAL_COMMUNITY): Payer: Managed Care, Other (non HMO) | Admitting: *Deleted

## 2019-05-31 ENCOUNTER — Ambulatory Visit (HOSPITAL_COMMUNITY): Payer: Managed Care, Other (non HMO) | Attending: Obstetrics | Admitting: *Deleted

## 2019-05-31 ENCOUNTER — Encounter (HOSPITAL_COMMUNITY): Payer: Self-pay

## 2019-05-31 ENCOUNTER — Other Ambulatory Visit: Payer: Self-pay

## 2019-05-31 VITALS — BP 116/59 | HR 80 | Temp 97.9°F

## 2019-05-31 DIAGNOSIS — Z3A37 37 weeks gestation of pregnancy: Secondary | ICD-10-CM | POA: Insufficient documentation

## 2019-05-31 DIAGNOSIS — O24410 Gestational diabetes mellitus in pregnancy, diet controlled: Secondary | ICD-10-CM | POA: Diagnosis present

## 2019-05-31 NOTE — Procedures (Signed)
Sierra Gamble 1985-09-22 [redacted]w[redacted]d  Fetus A Non-Stress Test Interpretation for 05/31/19  Indication: IUGR  Fetal Heart Rate A Mode: External Baseline Rate (A): 130 bpm Variability: Moderate Accelerations: 15 x 15 Decelerations: None Multiple birth?: No  Uterine Activity Mode: Palpation, Toco Contraction Frequency (min): U/I Contraction Duration (sec): 20-30 Contraction Quality: Mild Resting Tone Palpated: Relaxed Resting Time: Adequate  Interpretation (Fetal Testing) Nonstress Test Interpretation: Reactive Comments: Reviewed tracing with Dr. Annamaria Boots

## 2019-06-01 ENCOUNTER — Encounter: Payer: Self-pay | Admitting: Obstetrics and Gynecology

## 2019-06-01 ENCOUNTER — Other Ambulatory Visit (HOSPITAL_COMMUNITY)
Admission: RE | Admit: 2019-06-01 | Discharge: 2019-06-01 | Disposition: A | Discharge status: Home / Self Care | Payer: Managed Care, Other (non HMO) | Source: Ambulatory Visit | Attending: Obstetrics and Gynecology | Admitting: Obstetrics and Gynecology

## 2019-06-01 ENCOUNTER — Ambulatory Visit (INDEPENDENT_AMBULATORY_CARE_PROVIDER_SITE_OTHER): Payer: Managed Care, Other (non HMO) | Admitting: Obstetrics and Gynecology

## 2019-06-01 VITALS — BP 115/81 | HR 76 | Wt 159.0 lb

## 2019-06-01 DIAGNOSIS — O24410 Gestational diabetes mellitus in pregnancy, diet controlled: Secondary | ICD-10-CM

## 2019-06-01 DIAGNOSIS — O09899 Supervision of other high risk pregnancies, unspecified trimester: Secondary | ICD-10-CM

## 2019-06-01 DIAGNOSIS — Z3A37 37 weeks gestation of pregnancy: Secondary | ICD-10-CM

## 2019-06-01 DIAGNOSIS — O0990 Supervision of high risk pregnancy, unspecified, unspecified trimester: Secondary | ICD-10-CM

## 2019-06-01 DIAGNOSIS — O99019 Anemia complicating pregnancy, unspecified trimester: Secondary | ICD-10-CM

## 2019-06-01 DIAGNOSIS — O365930 Maternal care for other known or suspected poor fetal growth, third trimester, not applicable or unspecified: Secondary | ICD-10-CM

## 2019-06-01 DIAGNOSIS — O99013 Anemia complicating pregnancy, third trimester: Secondary | ICD-10-CM

## 2019-06-01 DIAGNOSIS — O0993 Supervision of high risk pregnancy, unspecified, third trimester: Secondary | ICD-10-CM

## 2019-06-01 DIAGNOSIS — Z283 Underimmunization status: Secondary | ICD-10-CM

## 2019-06-01 DIAGNOSIS — O99891 Other specified diseases and conditions complicating pregnancy: Secondary | ICD-10-CM

## 2019-06-01 DIAGNOSIS — Z8751 Personal history of pre-term labor: Secondary | ICD-10-CM

## 2019-06-01 NOTE — Progress Notes (Signed)
Glucose from BR[facility]:

## 2019-06-01 NOTE — Progress Notes (Signed)
Subjective:  Sierra Gamble is a 33 y.o. [id number] at [redacted]w[redacted]d being seen today for ongoing prenatal care.  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  Patient reports general discomforts of pregnancy.  Contractions: Not present. Vag. Bleeding: None.  Movement: Present. Denies leaking of fluid.   The following portions of the patient's history were reviewed and updated as appropriate: allergies, current medications, past family history, past medical history, past social history, past surgical history and problem list. Problem list updated.  Objective:   Vitals:   06/01/19 1047  BP: 115/81  Pulse: 76  Weight: 159 lb (72.1 kg)    Fetal Status: Fetal Heart Rate (bpm): 131   Movement: Present     General:  Alert, oriented and cooperative. Patient is in no acute distress.  Skin: Skin is warm and dry. No rash noted.   Cardiovascular: Normal heart rate noted  Respiratory: Normal respiratory effort, no problems with respiration noted  Abdomen: Soft, gravid, appropriate for gestational age. Pain/Pressure: Present     Pelvic:  Cervical exam performed        Extremities: Normal range of motion.  Edema: None  Mental Status: Normal mood and affect. Normal behavior. Normal judgment and thought content.   Urinalysis:      Assessment and Plan:  Pregnancy: [id number] at [redacted]w[redacted]d  1. Supervision of high risk pregnancy, antepartum Stable Labor precautions - GC/Chlamydia probe amp ([place])not at [facility] - Culture, beta strep (group b only)  2. Poor fetal growth affecting management of  mother in third trimester, single or unspecified fetus Growth 27 % on 05/22/19 Weekly antenatal testing with MFM  3. History of preterm delivery Has completed 17 OHP  4. GDM (gestational diabetes mellitus), class A1 CBG's in goal range However has not been checking d/t mother recently having a CVA Pt encouraged to check CBG's  5. Anemia complicating pregnancy, unspecified trimester S/P Feraheme   6. Rubella non-immune status, antepartum Vaccine PP  Term labor symptoms and general obstetric precautions including but not limited to vaginal bleeding, contractions, leaking of fluid and fetal movement were reviewed in detail with the patient. Please refer to After Visit Summary for other counseling recommendations.  Return in about 1 day (around 06/02/2019) for OB visit, face to face, MD.   Chancy Milroy, MD

## 2019-06-01 NOTE — Patient Instructions (Signed)
Third Trimester of Pregnancy The third trimester is from week 28 through week 40 (months 7 through 9). The third trimester is a time when the unborn baby (fetus) is growing rapidly. At the end of the ninth month, the fetus is about 20 inches in length and weighs 6-10 pounds. Body changes during your third trimester Your body will continue to go through many changes during pregnancy. The changes vary from woman to woman. During the third trimester:  Your weight will continue to increase. You can expect to gain 25-35 pounds (11-16 kg) by the end of the pregnancy.  You may begin to get stretch marks on your hips, abdomen, and breasts.  You may urinate more often because the fetus is moving lower into your pelvis and pressing on your bladder.  You may develop or continue to have heartburn. This is caused by increased hormones that slow down muscles in the digestive tract.  You may develop or continue to have constipation because increased hormones slow digestion and cause the muscles that push waste through your intestines to relax.  You may develop hemorrhoids. These are swollen veins (varicose veins) in the rectum that can itch or be painful.  You may develop swollen, bulging veins (varicose veins) in your legs.  You may have increased body aches in the pelvis, back, or thighs. This is due to weight gain and increased hormones that are relaxing your joints.  You may have changes in your hair. These can include thickening of your hair, rapid growth, and changes in texture. Some women also have hair loss during or after pregnancy, or hair that feels dry or thin. Your hair will most likely return to normal after your baby is born.  Your breasts will continue to grow and they will continue to become tender. A yellow fluid (colostrum) may leak from your breasts. This is the first milk you are producing for your baby.  Your belly button may stick out.  You may notice more swelling in your hands,  face, or ankles.  You may have increased tingling or numbness in your hands, arms, and legs. The skin on your belly may also feel numb.  You may feel short of breath because of your expanding uterus.  You may have more problems sleeping. This can be caused by the size of your belly, increased need to urinate, and an increase in your body's metabolism.  You may notice the fetus "dropping," or moving lower in your abdomen (lightening).  You may have increased vaginal discharge.  You may notice your joints feel loose and you may have pain around your pelvic bone. What to expect at prenatal visits You will have prenatal exams every 2 weeks until week 36. Then you will have weekly prenatal exams. During a routine prenatal visit:  You will be weighed to make sure you and the baby are growing normally.  Your blood pressure will be taken.  Your abdomen will be measured to track your baby's growth.  The fetal heartbeat will be listened to.  Any test results from the previous visit will be discussed.  You may have a cervical check near your due date to see if your cervix has softened or thinned (effaced).  You will be tested for Group B streptococcus. This happens between 35 and 37 weeks. Your health care provider may ask you:  What your birth plan is.  How you are feeling.  If you are feeling the baby move.  If you have had any abnormal   symptoms, such as leaking fluid, bleeding, severe headaches, or abdominal cramping.  If you are using any tobacco products, including cigarettes, chewing tobacco, and electronic cigarettes.  If you have any questions. Other tests or screenings that may be performed during your third trimester include:  Blood tests that check for low iron levels (anemia).  Fetal testing to check the health, activity level, and growth of the fetus. Testing is done if you have certain medical conditions or if there are problems during the pregnancy.  Nonstress test  (NST). This test checks the health of your baby to make sure there are no signs of problems, such as the baby not getting enough oxygen. During this test, a belt is placed around your belly. The baby is made to move, and its heart rate is monitored during movement. What is false labor? False labor is a condition in which you feel small, irregular tightenings of the muscles in the womb (contractions) that usually go away with rest, changing position, or drinking water. These are called Braxton Hicks contractions. Contractions may last for hours, days, or even weeks before true labor sets in. If contractions come at regular intervals, become more frequent, increase in intensity, or become painful, you should see your health care provider. What are the signs of labor?  Abdominal cramps.  Regular contractions that start at 10 minutes apart and become stronger and more frequent with time.  Contractions that start on the top of the uterus and spread down to the lower abdomen and back.  Increased pelvic pressure and dull back pain.  A watery or bloody mucus discharge that comes from the vagina.  Leaking of amniotic fluid. This is also known as your "water breaking." It could be a slow trickle or a gush. Let your health care provider know if it has a color or strange odor. If you have any of these signs, call your health care provider right away, even if it is before your due date. Follow these instructions at home: Medicines  Follow your health care provider's instructions regarding medicine use. Specific medicines may be either safe or unsafe to take during pregnancy.  Take a prenatal vitamin that contains at least 600 micrograms (mcg) of folic acid.  If you develop constipation, try taking a stool softener if your health care provider approves. Eating and drinking   Eat a balanced diet that includes fresh fruits and vegetables, whole grains, good sources of protein such as meat, eggs, or tofu,  and low-fat dairy. Your health care provider will help you determine the amount of weight gain that is right for you.  Avoid raw meat and uncooked cheese. These carry germs that can cause birth defects in the baby.  If you have low calcium intake from food, talk to your health care provider about whether you should take a daily calcium supplement.  Eat four or five small meals rather than three large meals a day.  Limit foods that are high in fat and processed sugars, such as fried and sweet foods.  To prevent constipation: ? Drink enough fluid to keep your urine clear or pale yellow. ? Eat foods that are high in fiber, such as fresh fruits and vegetables, whole grains, and beans. Activity  Exercise only as directed by your health care provider. Most women can continue their usual exercise routine during pregnancy. Try to exercise for 30 minutes at least 5 days a week. Stop exercising if you experience uterine contractions.  Avoid heavy lifting.  Do   not exercise in extreme heat or humidity, or at high altitudes.  Wear low-heel, comfortable shoes.  Practice good posture.  You may continue to have sex unless your health care provider tells you otherwise. Relieving pain and discomfort  Take frequent breaks and rest with your legs elevated if you have leg cramps or low back pain.  Take warm sitz baths to soothe any pain or discomfort caused by hemorrhoids. Use hemorrhoid cream if your health care provider approves.  Wear a good support bra to prevent discomfort from breast tenderness.  If you develop varicose veins: ? Wear support pantyhose or compression stockings as told by your healthcare provider. ? Elevate your feet for 15 minutes, 3-4 times a day. Prenatal care  Write down your questions. Take them to your prenatal visits.  Keep all your prenatal visits as told by your health care provider. This is important. Safety  Wear your seat belt at all times when driving.  Make  a list of emergency phone numbers, including numbers for family, friends, the hospital, and police and fire departments. General instructions  Avoid cat litter boxes and soil used by cats. These carry germs that can cause birth defects in the baby. If you have a cat, ask someone to clean the litter box for you.  Do not travel far distances unless it is absolutely necessary and only with the approval of your health care provider.  Do not use hot tubs, steam rooms, or saunas.  Do not drink alcohol.  Do not use any products that contain nicotine or tobacco, such as cigarettes and e-cigarettes. If you need help quitting, ask your health care provider.  Do not use any medicinal herbs or unprescribed drugs. These chemicals affect the formation and growth of the baby.  Do not douche or use tampons or scented sanitary pads.  Do not cross your legs for long periods of time.  To prepare for the arrival of your baby: ? Take prenatal classes to understand, practice, and ask questions about labor and delivery. ? Make a trial run to the hospital. ? Visit the hospital and tour the maternity area. ? Arrange for maternity or paternity leave through employers. ? Arrange for family and friends to take care of pets while you are in the hospital. ? Purchase a rear-facing car seat and make sure you know how to install it in your car. ? Pack your hospital bag. ? Prepare the baby's nursery. Make sure to remove all pillows and stuffed animals from the baby's crib to prevent suffocation.  Visit your dentist if you have not gone during your pregnancy. Use a soft toothbrush to brush your teeth and be gentle when you floss. Contact a health care provider if:  You are unsure if you are in labor or if your water has broken.  You become dizzy.  You have mild pelvic cramps, pelvic pressure, or nagging pain in your abdominal area.  You have lower back pain.  You have persistent nausea, vomiting, or diarrhea.   You have an unusual or bad smelling vaginal discharge.  You have pain when you urinate. Get help right away if:  Your water breaks before 37 weeks.  You have regular contractions less than 5 minutes apart before 37 weeks.  You have a fever.  You are leaking fluid from your vagina.  You have spotting or bleeding from your vagina.  You have severe abdominal pain or cramping.  You have rapid weight loss or weight gain.  You have  shortness of breath with chest pain.  You notice sudden or extreme swelling of your face, hands, ankles, feet, or legs.  Your baby makes fewer than 10 movements in 2 hours.  You have severe headaches that do not go away when you take medicine.  You have vision changes. Summary  The third trimester is from week 28 through week 40, months 7 through 9. The third trimester is a time when the unborn baby (fetus) is growing rapidly.  During the third trimester, your discomfort may increase as you and your baby continue to gain weight. You may have abdominal, leg, and back pain, sleeping problems, and an increased need to urinate.  During the third trimester your breasts will keep growing and they will continue to become tender. A yellow fluid (colostrum) may leak from your breasts. This is the first milk you are producing for your baby.  False labor is a condition in which you feel small, irregular tightenings of the muscles in the womb (contractions) that eventually go away. These are called Braxton Hicks contractions. Contractions may last for hours, days, or even weeks before true labor sets in.  Signs of labor can include: abdominal cramps; regular contractions that start at 10 minutes apart and become stronger and more frequent with time; watery or bloody mucus discharge that comes from the vagina; increased pelvic pressure and dull back pain; and leaking of amniotic fluid. This information is not intended to replace advice given to you by your health  care provider. Make sure you discuss any questions you have with your health care provider. Document Released: 06/09/2001 Document Revised: 10/06/2018 Document Reviewed: 07/21/2016 Elsevier Patient Education  2020 [employer].

## 2019-06-02 LAB — GC/CHLAMYDIA PROBE AMP (~~LOC~~) NOT AT ARMC
Chlamydia: NEGATIVE
Comment: NEGATIVE
Comment: NORMAL
Neisseria Gonorrhea: NEGATIVE

## 2019-06-04 LAB — CULTURE, BETA STREP (GROUP B ONLY): Strep Gp B Culture: NEGATIVE

## 2019-06-07 ENCOUNTER — Ambulatory Visit (HOSPITAL_COMMUNITY): Payer: Managed Care, Other (non HMO) | Attending: Obstetrics | Admitting: *Deleted

## 2019-06-07 ENCOUNTER — Ambulatory Visit (HOSPITAL_COMMUNITY): Payer: Managed Care, Other (non HMO) | Admitting: *Deleted

## 2019-06-07 ENCOUNTER — Other Ambulatory Visit: Payer: Self-pay

## 2019-06-07 ENCOUNTER — Encounter (HOSPITAL_COMMUNITY): Payer: Self-pay

## 2019-06-07 VITALS — BP 109/64 | HR 78 | Temp 97.8°F

## 2019-06-07 DIAGNOSIS — Z3A38 38 weeks gestation of pregnancy: Secondary | ICD-10-CM | POA: Diagnosis not present

## 2019-06-07 DIAGNOSIS — O24410 Gestational diabetes mellitus in pregnancy, diet controlled: Secondary | ICD-10-CM | POA: Diagnosis present

## 2019-06-07 DIAGNOSIS — O365930 Maternal care for other known or suspected poor fetal growth, third trimester, not applicable or unspecified: Secondary | ICD-10-CM

## 2019-06-07 NOTE — Procedures (Signed)
Sierra Gamble 06-24-1986 [redacted]w[redacted]d  Fetus A Non-Stress Test Interpretation for 06/07/19  Indication: IUGR  Fetal Heart Rate A Mode: External Baseline Rate (A): 145 bpm Variability: Moderate Accelerations: 15 x 15 Decelerations: None Multiple birth?: No  Uterine Activity Mode: Palpation, Toco Contraction Frequency (min): x2 with U/I Contraction Duration (sec): 60 Contraction Quality: Mild(pt denies feeling any) Resting Tone Palpated: Relaxed Resting Time: Adequate  Interpretation (Fetal Testing) Nonstress Test Interpretation: Reactive Comments: Reviewed tracing with Dr. Donalee Citrin

## 2019-06-12 ENCOUNTER — Telehealth (HOSPITAL_COMMUNITY): Payer: Self-pay | Admitting: *Deleted

## 2019-06-12 ENCOUNTER — Telehealth: Payer: Self-pay

## 2019-06-12 ENCOUNTER — Other Ambulatory Visit: Payer: Self-pay | Admitting: Family Medicine

## 2019-06-12 ENCOUNTER — Ambulatory Visit (INDEPENDENT_AMBULATORY_CARE_PROVIDER_SITE_OTHER): Payer: Managed Care, Other (non HMO) | Admitting: Family Medicine

## 2019-06-12 VITALS — BP 124/84 | HR 70 | Wt 161.5 lb

## 2019-06-12 DIAGNOSIS — O365930 Maternal care for other known or suspected poor fetal growth, third trimester, not applicable or unspecified: Secondary | ICD-10-CM

## 2019-06-12 DIAGNOSIS — Z3A38 38 weeks gestation of pregnancy: Secondary | ICD-10-CM

## 2019-06-12 DIAGNOSIS — O09899 Supervision of other high risk pregnancies, unspecified trimester: Secondary | ICD-10-CM

## 2019-06-12 DIAGNOSIS — Z8751 Personal history of pre-term labor: Secondary | ICD-10-CM

## 2019-06-12 DIAGNOSIS — O24410 Gestational diabetes mellitus in pregnancy, diet controlled: Secondary | ICD-10-CM

## 2019-06-12 DIAGNOSIS — O0990 Supervision of high risk pregnancy, unspecified, unspecified trimester: Secondary | ICD-10-CM

## 2019-06-12 DIAGNOSIS — O99013 Anemia complicating pregnancy, third trimester: Secondary | ICD-10-CM

## 2019-06-12 DIAGNOSIS — Z3A39 39 weeks gestation of pregnancy: Secondary | ICD-10-CM

## 2019-06-12 DIAGNOSIS — O0993 Supervision of high risk pregnancy, unspecified, third trimester: Secondary | ICD-10-CM

## 2019-06-12 DIAGNOSIS — O99891 Other specified diseases and conditions complicating pregnancy: Secondary | ICD-10-CM

## 2019-06-12 DIAGNOSIS — Z283 Underimmunization status: Secondary | ICD-10-CM

## 2019-06-12 DIAGNOSIS — O99019 Anemia complicating pregnancy, unspecified trimester: Secondary | ICD-10-CM

## 2019-06-12 NOTE — Progress Notes (Signed)
BRx Glucose Readings:     

## 2019-06-12 NOTE — Progress Notes (Signed)
   PRENATAL VISIT NOTE  Subjective:  Sierra Gamble is a 33 y.o. [id number] at [redacted]w[redacted]d being seen today for ongoing prenatal care.  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  Patient reports no complaints.  Contractions: Irritability. Vag. Bleeding: None.  Movement: Present. Denies leaking of fluid.   The following portions of the patient's history were reviewed and updated as appropriate: allergies, current medications, past family history, past medical history, past social history, past surgical history and problem list.   Objective:   Vitals:   06/12/19 1039  BP: 124/84  Pulse: 70  Weight: 161 lb 8 oz (73.3 kg)    Fetal Status: Fetal Heart Rate (bpm):  133   Movement: Present     General:  Alert, oriented and cooperative. Patient is in no acute distress.  Skin: Skin is warm and dry. No rash noted.   Cardiovascular: Normal heart rate noted  Respiratory: Normal respiratory effort, no problems with respiration noted  Abdomen: Soft, gravid, appropriate for gestational age.  Pain/Pressure: Present     Pelvic: Cervical exam deferred        Extremities: Normal range of motion.  Edema: None  Mental Status: Normal mood and affect. Normal behavior. Normal judgment and thought content.   Assessment and Plan:  Pregnancy: [id number] at [redacted]w[redacted]d 1. Supervision of high risk pregnancy, antepartum Stable Labor precautions GBS negative  GDMA1; patient would like to not be in the hospital on 12/24 or 12/25; will induce on 12/20 at 0000 Girl, breast, Depo  2. Poor fetal growth  affecting management of mother in third trimester, single or unspecified fetus Growth 27% on 05/19/19 BPP 8/8 on 11/25 Weekly antenatal testing with MFM  3. History of preterm delivery Has completed 17 OHP  4. GDM (gestational diabetes mellitus), class A1 CBG's in goal range  5. Anemia complicating pregnancy, unspecified trimester S/P Feraheme Hgb 8.9 on 9/23  6. Rubella non-immune status, antepartum Vaccine PP  Term labor symptoms and general obstetric precautions including but not limited to vaginal bleeding, contractions, leaking of fluid and fetal movement were reviewed in detail with the patient. Please refer to After Visit Summary for other counseling recommendations.   Return if symptoms worsen or fail to improve.  No future appointments.  Chauncey Mann, MD

## 2019-06-12 NOTE — Telephone Encounter (Signed)
Preadmission screen  

## 2019-06-12 NOTE — Telephone Encounter (Signed)
Called pt to advise of IOL ON 06/18/19 @ Midnight & to arrive at 11:45p 06/17/2019, also advised her to disregard date that will be sent thru My Chart, no answer, left VM.

## 2019-06-13 ENCOUNTER — Encounter: Payer: Self-pay | Admitting: General Practice

## 2019-06-13 ENCOUNTER — Encounter (HOSPITAL_COMMUNITY): Payer: Self-pay | Admitting: *Deleted

## 2019-06-13 ENCOUNTER — Telehealth (HOSPITAL_COMMUNITY): Payer: Self-pay | Admitting: *Deleted

## 2019-06-13 NOTE — Telephone Encounter (Signed)
Preadmission screen  

## 2019-06-14 ENCOUNTER — Other Ambulatory Visit: Payer: Self-pay | Admitting: Women's Health

## 2019-06-16 ENCOUNTER — Other Ambulatory Visit (HOSPITAL_COMMUNITY)
Admission: RE | Admit: 2019-06-16 | Discharge: 2019-06-16 | Disposition: A | Discharge status: Home / Self Care | Payer: Managed Care, Other (non HMO) | Source: Ambulatory Visit | Attending: Obstetrics and Gynecology | Admitting: Obstetrics and Gynecology

## 2019-06-16 LAB — SARS CORONAVIRUS 2 (TAT 6-24 HRS): SARS Coronavirus 2: POSITIVE — AB

## 2019-06-17 ENCOUNTER — Inpatient Hospital Stay (HOSPITAL_COMMUNITY)
Admission: AD | Admit: 2019-06-17 | Discharge: 2019-06-18 | DRG: 805 | Disposition: A | Discharge status: Home / Self Care | Payer: Managed Care, Other (non HMO) | Attending: Obstetrics & Gynecology | Admitting: Obstetrics & Gynecology

## 2019-06-17 ENCOUNTER — Other Ambulatory Visit: Payer: Self-pay

## 2019-06-17 ENCOUNTER — Encounter (HOSPITAL_COMMUNITY): Payer: Self-pay | Admitting: Obstetrics & Gynecology

## 2019-06-17 DIAGNOSIS — O9902 Anemia complicating childbirth: Secondary | ICD-10-CM | POA: Diagnosis present

## 2019-06-17 DIAGNOSIS — U071 COVID-19: Secondary | ICD-10-CM

## 2019-06-17 DIAGNOSIS — O365930 Maternal care for other known or suspected poor fetal growth, third trimester, not applicable or unspecified: Secondary | ICD-10-CM | POA: Diagnosis present

## 2019-06-17 DIAGNOSIS — Z3A39 39 weeks gestation of pregnancy: Secondary | ICD-10-CM

## 2019-06-17 DIAGNOSIS — O24410 Gestational diabetes mellitus in pregnancy, diet controlled: Secondary | ICD-10-CM | POA: Diagnosis present

## 2019-06-17 DIAGNOSIS — O09899 Supervision of other high risk pregnancies, unspecified trimester: Secondary | ICD-10-CM

## 2019-06-17 DIAGNOSIS — D582 Other hemoglobinopathies: Secondary | ICD-10-CM | POA: Diagnosis present

## 2019-06-17 DIAGNOSIS — O24420 Gestational diabetes mellitus in childbirth, diet controlled: Secondary | ICD-10-CM | POA: Diagnosis present

## 2019-06-17 DIAGNOSIS — O9852 Other viral diseases complicating childbirth: Secondary | ICD-10-CM | POA: Diagnosis present

## 2019-06-17 DIAGNOSIS — O26893 Other specified pregnancy related conditions, third trimester: Secondary | ICD-10-CM | POA: Diagnosis present

## 2019-06-17 DIAGNOSIS — Z148 Genetic carrier of other disease: Secondary | ICD-10-CM

## 2019-06-17 DIAGNOSIS — Z8751 Personal history of pre-term labor: Secondary | ICD-10-CM

## 2019-06-17 DIAGNOSIS — O99019 Anemia complicating pregnancy, unspecified trimester: Secondary | ICD-10-CM | POA: Diagnosis present

## 2019-06-17 DIAGNOSIS — Z2839 Other underimmunization status: Secondary | ICD-10-CM

## 2019-06-17 DIAGNOSIS — Z283 Underimmunization status: Secondary | ICD-10-CM

## 2019-06-17 HISTORY — DX: COVID-19: U07.1

## 2019-06-17 LAB — CBC
HCT: 30.9 % — ABNORMAL LOW (ref 36.0–46.0)
HCT: 27.5 % — ABNORMAL LOW (ref 36.0–46.0)
Hemoglobin: 10.1 g/dL — ABNORMAL LOW (ref 12.0–15.0)
Hemoglobin: 9.2 g/dL — ABNORMAL LOW (ref 12.0–15.0)
MCH: 26 pg (ref 26.0–34.0)
MCH: 26.3 pg (ref 26.0–34.0)
MCHC: 32.7 g/dL (ref 30.0–36.0)
MCHC: 33.5 g/dL (ref 30.0–36.0)
MCV: 79.6 fL — ABNORMAL LOW (ref 80.0–100.0)
MCV: 78.6 fL — ABNORMAL LOW (ref 80.0–100.0)
Platelets: 305 10*3/uL (ref 150–400)
Platelets: 254 10*3/uL (ref 150–400)
RBC: 3.88 MIL/uL (ref 3.87–5.11)
RBC: 3.5 MIL/uL — ABNORMAL LOW (ref 3.87–5.11)
RDW: 15.1 % (ref 11.5–15.5)
RDW: 14.8 % (ref 11.5–15.5)
WBC: 11 10*3/uL — ABNORMAL HIGH (ref 4.0–10.5)
WBC: 14.2 10*3/uL — ABNORMAL HIGH (ref 4.0–10.5)
nRBC: 0 % (ref 0.0–0.2)
nRBC: 0 % (ref 0.0–0.2)

## 2019-06-17 LAB — TYPE AND SCREEN
ABO/RH(D): O POS
Antibody Screen: NEGATIVE

## 2019-06-17 LAB — COMPREHENSIVE METABOLIC PANEL
ALT: 13 U/L (ref 0–44)
AST: 27 U/L (ref 15–41)
Albumin: 3 g/dL — ABNORMAL LOW (ref 3.5–5.0)
Alkaline Phosphatase: 112 U/L (ref 38–126)
Anion gap: 11 (ref 5–15)
BUN: 8 mg/dL (ref 6–20)
CO2: 21 mmol/L — ABNORMAL LOW (ref 22–32)
Calcium: 9.4 mg/dL (ref 8.9–10.3)
Chloride: 105 mmol/L (ref 98–111)
Creatinine, Ser: 0.81 mg/dL (ref 0.44–1.00)
GFR calc Af Amer: >60 mL/min (ref >60)
GFR calc non Af Amer: >60 mL/min (ref >60)
Glucose, Bld: 74 mg/dL (ref 70–99)
Potassium: 4 mmol/L (ref 3.5–5.1)
Sodium: 137 mmol/L (ref 135–145)
Total Bilirubin: 0.3 mg/dL (ref 0.3–1.2)
Total Protein: 6.5 g/dL (ref 6.5–8.1)

## 2019-06-17 LAB — CREATININE, SERUM
Creatinine, Ser: 0.61 mg/dL (ref 0.44–1.00)
GFR calc Af Amer: >60 mL/min (ref >60)
GFR calc non Af Amer: >60 mL/min (ref >60)

## 2019-06-17 LAB — PROTEIN / CREATININE RATIO, URINE
Creatinine, Urine: 269.6 mg/dL
Protein Creatinine Ratio: 0.16 mg/mg{Cre} — ABNORMAL HIGH (ref 0.00–0.15)
Total Protein, Urine: 42 mg/dL

## 2019-06-17 LAB — ABO/RH: ABO/RH(D): O POS

## 2019-06-17 MED ORDER — SIMETHICONE 80 MG PO CHEW: 80.0000 mg | CHEWABLE_TABLET | ORAL | Status: DC | PRN

## 2019-06-17 MED ORDER — WITCH HAZEL-GLYCERIN EX PADS: 1.0000 "application " | MEDICATED_PAD | CUTANEOUS | Status: DC | PRN

## 2019-06-17 MED ORDER — OXYCODONE HCL 5 MG PO TABS: 5.0000 mg | ORAL_TABLET | ORAL | Status: DC | PRN

## 2019-06-17 MED ORDER — ACETAMINOPHEN 325 MG PO TABS: 650.0000 mg | ORAL_TABLET | ORAL | Status: DC | PRN

## 2019-06-17 MED ORDER — OXYTOCIN 40 UNITS IN NORMAL SALINE INFUSION - SIMPLE MED
2.5000 [IU]/h | INTRAVENOUS | Status: DC
  Filled 2019-06-17: qty 1000

## 2019-06-17 MED ORDER — COCONUT OIL OIL: 1.0000 "application " | TOPICAL_OIL | Status: DC | PRN

## 2019-06-17 MED ORDER — ENOXAPARIN SODIUM 40 MG/0.4ML ~~LOC~~ SOLN
40.0000 mg | SUBCUTANEOUS | Status: DC
  Filled 2019-06-17: qty 0.4

## 2019-06-17 MED ORDER — LACTATED RINGERS IV SOLN
INTRAVENOUS | Status: DC
  Administered 2019-06-17: 10:00:00 125 mL/h via INTRAVENOUS

## 2019-06-17 MED ORDER — DIPHENHYDRAMINE HCL 25 MG PO CAPS: 25.0000 mg | ORAL_CAPSULE | Freq: Four times a day (QID) | ORAL | Status: DC | PRN

## 2019-06-17 MED ORDER — SENNOSIDES-DOCUSATE SODIUM 8.6-50 MG PO TABS
2.0000 | ORAL_TABLET | ORAL | Status: DC
  Administered 2019-06-18: 2 via ORAL
  Filled 2019-06-17: qty 2

## 2019-06-17 MED ORDER — ONDANSETRON HCL 4 MG PO TABS: 4.0000 mg | ORAL_TABLET | ORAL | Status: DC | PRN

## 2019-06-17 MED ORDER — OXYTOCIN BOLUS FROM INFUSION
500.0000 mL | Freq: Once | INTRAVENOUS | Status: AC
  Administered 2019-06-17: 500 mL via INTRAVENOUS

## 2019-06-17 MED ORDER — LACTATED RINGERS IV SOLN: 500.0000 mL | INTRAVENOUS | Status: DC | PRN

## 2019-06-17 MED ORDER — ONDANSETRON HCL 4 MG/2ML IJ SOLN: 4.0000 mg | Freq: Four times a day (QID) | INTRAMUSCULAR | Status: DC | PRN

## 2019-06-17 MED ORDER — BENZOCAINE-MENTHOL 20-0.5 % EX AERO: 1.0000 "application " | INHALATION_SPRAY | CUTANEOUS | Status: DC | PRN

## 2019-06-17 MED ORDER — PRENATAL MULTIVITAMIN CH: 1.0000 | ORAL_TABLET | Freq: Every day | ORAL | Status: DC

## 2019-06-17 MED ORDER — DIBUCAINE (PERIANAL) 1 % EX OINT: 1.0000 "application " | TOPICAL_OINTMENT | CUTANEOUS | Status: DC | PRN

## 2019-06-17 MED ORDER — OXYCODONE HCL 5 MG PO TABS: 10.0000 mg | ORAL_TABLET | ORAL | Status: DC | PRN

## 2019-06-17 MED ORDER — MISOPROSTOL 50MCG HALF TABLET: 50.0000 ug | ORAL_TABLET | ORAL | Status: DC | PRN

## 2019-06-17 MED ORDER — TERBUTALINE SULFATE 1 MG/ML IJ SOLN: 0.2500 mg | Freq: Once | INTRAMUSCULAR | Status: DC | PRN

## 2019-06-17 MED ORDER — FENTANYL CITRATE (PF) 100 MCG/2ML IJ SOLN
50.0000 ug | Freq: Once | INTRAMUSCULAR | Status: AC
  Administered 2019-06-17: 50 ug via INTRAVENOUS
  Filled 2019-06-17: qty 2

## 2019-06-17 MED ORDER — SOD CITRATE-CITRIC ACID 500-334 MG/5ML PO SOLN: 30.0000 mL | ORAL | Status: DC | PRN

## 2019-06-17 MED ORDER — IBUPROFEN 600 MG PO TABS
600.0000 mg | ORAL_TABLET | Freq: Four times a day (QID) | ORAL | Status: DC
  Administered 2019-06-17 – 2019-06-18 (×4): 600 mg via ORAL
  Filled 2019-06-17 (×3): qty 1

## 2019-06-17 MED ORDER — ONDANSETRON HCL 4 MG/2ML IJ SOLN: 4.0000 mg | INTRAMUSCULAR | Status: DC | PRN

## 2019-06-17 MED ORDER — LIDOCAINE HCL (PF) 1 % IJ SOLN: 30.0000 mL | INTRAMUSCULAR | Status: DC | PRN

## 2019-06-17 NOTE — H&P (Addendum)
LABOR AND DELIVERY ADMISSION HISTORY AND PHYSICAL NOTE  Sierra Gamble is a 33 y.o. female [phone] with IUP at 17w3dby LMP presenting for SOL.  She reports positive fetal movement. She denies leakage of fluid or vaginal bleeding.  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 Anemia   . Gestational diabetes     Past Surgical History: Past Surgical History:  Procedure Laterality Date  . NO PAST SURGERIES      Obstetrical History: OB History    Gravida  4   Para  2   Term  1   Preterm  1   AB  1   Living  2     SAB  1   TAB      Ectopic      Multiple      Live Births  2           Social History: Social History   Socioeconomic History  . Marital status: Single    Spouse name: Not on file  . Number of children: Not on file  . Years of education: Not on file  . Highest education level: Not on file  Occupational History  . Not on file  Tobacco Use  . Smoking status: Never Smoker  . Smokeless tobacco: Never Used  Substance and Sexual Activity  . Alcohol use: No  . Drug use: No  . Sexual activity: Yes    Birth control/protection: None  Other Topics Concern  . Not on file  Social History Narrative  . Not on file   Social Determinants of Health   Financial Resource Strain:   . Difficulty of Paying Living Expenses: Not on file  Food Insecurity: No Food Insecurity  . Worried About RCharity fundraiserin the Last Year: Never true  . Ran Out of Food in the Last Year: Never true  Transportation Needs: No Transportation Needs  . Lack of Transportation (Medical): No  . Lack of Transportation (Non-Medical): No  Physical Activity:   .  Days of Exercise per Week: Not on file  . Minutes of Exercise per Session: Not on file  Stress:   . Feeling of Stress : Not on file  Social Connections:   . Frequency of Communication with Friends and Family: Not on file  . Frequency of Social Gatherings with Friends and Family: Not on file  . Attends Religious Services: Not on file  . Active Member of Clubs or Organizations: Not on file  . Attends CArchivistMeetings: Not on file  . Marital Status: Not on file    Family History: Family History  Problem Relation Age of Onset  . Thyroid disease Mother   . Stroke Mother   . Hyperlipidemia Mother   . Diabetes Maternal Grandmother   . Thyroid disease Maternal Grandmother     Allergies: No Known Allergies  Medications Prior to Admission  Medication Sig Dispense Refill Last Dose  . Accu-Chek FastClix Lancets MISC 1 Device by Percutaneous route 4 (four) times daily. 100 each 12   . Blood Glucose Monitoring Suppl (ACCU-CHEK GUIDE) w/Device KIT 1 Device by Does not apply route 4 (four) times daily. 1 kit 0   .  Blood Pressure Monitoring (BLOOD PRESSURE KIT) DEVI 1 Device by Does not apply route as needed. ICD 10 :  O09.90 1 Device 0   . Elastic Bandages & Supports (COMFORT FIT MATERNITY SUPP MED) MISC 1 Units by Does not apply route daily. (Patient not taking: Reported on 05/12/2019) 1 each 0   . glucose blood test strip Use as instructed 100 each 12   . Prenat-FeCbn-FeAspGl-FA-Omega (OB COMPLETE/DHA) 30-10-1-200 MG CAPS Take 1 capsule by mouth daily. 90 capsule 1      Review of Systems  All systems reviewed and negative except as stated in HPI  Physical Exam Blood pressure 135/80, pulse 81, temperature 98.7 F (37.1 C), temperature source Oral, resp. rate 20, height '5\' 1"'  (1.549 m), weight 73 kg, last menstrual period 09/14/2018, unknown if currently breastfeeding. General appearance: alert, oriented, NAD, uncomfortable with contractions  Lungs: normal respiratory  effort Heart: regular rate Abdomen: soft, non-tender; gravid, FH appropriate for GA Extremities: No calf swelling or tenderness Presentation: cephalic Fetal monitoring: 140 bpm, moderate variability, +acels, no decels  Uterine activity: q1-4 min  Dilation: 9 Effacement (%): 90 Station: -1 Exam by:: MD Juleen China  Prenatal labs: ABO, Rh: --/--/O POS, O POS Performed at [facility], [address], [place], [place] [address]  [phone]) Antibody: NEG (12/19 0910) Rubella: <0.90 (06/10 1701) RPR: Non Reactive (09/23 0826)  HBsAg: Negative (06/10 1701)  HIV: Non Reactive (09/23 0826)  GC/Chlamydia: Negative  GBS: Negative/-- (12/03 1314)  2-hr GTT: Abnormal (73/188/140)  Genetic screening:  Low risk NIPs  Anatomy US: Normal   Prenatal Transfer Tool  Maternal Diabetes: No Genetic Screening: Normal Maternal Ultrasounds/Referrals: Normal Fetal Ultrasounds or other Referrals:  Referred to Materal Fetal Medicine for FGR  Maternal Substance Abuse:  No Significant Maternal Medications:  None Significant Maternal Lab Results: Group B Strep negative  Results for orders placed or performed during the hospital encounter of 06/17/19 (from the past 24 hour(s))  CBC   Collection Time: 06/17/19  9:10 AM  Result Value Ref Range   WBC 11.0 (H) 4.0 - 10.5 K/uL   RBC 3.88 3.87 - 5.11 MIL/uL   Hemoglobin 10.1 (L) 12.0 - 15.0 g/dL   HCT 30.9 (L) 36.0 - 46.0 %   MCV 79.6 (L) 80.0 - 100.0 fL   MCH 26.0 26.0 - 34.0 pg   MCHC 32.7 30.0 - 36.0 g/dL   RDW 15.1 11.5 - 15.5 %   Platelets 305 150 - 400 K/uL   nRBC 0.0 0.0 - 0.2 %  Type and screen   Collection Time: 06/17/19  9:10 AM  Result Value Ref Range   ABO/RH(D) O POS    Antibody Screen NEG    Sample Expiration      06/20/2019,2359 Performed at [facility], [place] [address], [place], [place] [address]   ABO/Rh   Collection Time: 06/17/19  9:10 AM  Result Value Ref Range   ABO/RH(D)      O POS Performed at [facility] [address], [place],  [address]     Patient Active Problem List   Diagnosis Date Noted  . Labor and delivery, indication for care 06/17/2019  . GDM (gestational diabetes mellitus), class A1 04/13/2019  . Poor fetal growth affecting management of mother in third trimester 04/06/2019  . History of preterm delivery 01/04/2019  . 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 Rubella non-immune status, antepartum 12/12/2018  . Anemia complicating pregnancy, unspecified trimester  12/30/2017  . Supervision of high risk pregnancy, antepartum 12/30/2017  . Maternal varicella, non-immune 12/30/2017    Assessment: Sierra Gamble is a 33 y.o. [id number] at 18w3dhere for SOL. Pregnancy complicated by [id number](was scheduled for induction tomorrow). Intrapartum BPs noted to be elevated, likely from pain. Will check PMoonshinelabs.   #Labor: Expectant management. AROM with thick meconium performed at admission. Will have NICU at delivery.   #Pain: Maternal support. Discussed risks of IV narcotics for neonate at time of birth.  #FWB: Cat I  #ID:  GBS neg. COVID+, airborne and contact precautions.  #MOF: Breast and bottle  #MOC:Undecided  #Circ:  N/A   CMelina Schools12/19/2020, 10:47 AM

## 2019-06-17 NOTE — Discharge Summary (Addendum)
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 yo [id number] 334w3das admitted in Active Labor on 06/17/2019. Patient had an uncomplicated labor course as follows:  Membrane Rupture Time/Date: 10:24 AM ,06/17/2019   Intrapartum Procedures: Episiotomy: None [1]                                         Lacerations:  None [1]  Patient had a delivery of a Viable infant. 06/17/2019  Information for the patient's newborn:  SoChaley, Castellanos0[[id number]]Delivery Method: Vaginal, Spontaneous(Filed from Delivery Summary)     Pateint had an uncomplicated postpartum course.  She is ambulating, tolerating a regular diet, passing flatus, and urinating well. Patient is discharged home in stable condition on 06/18/19.   Physical exam  Vitals:   06/17/19 1746 06/17/19 2142 06/18/19 0133 06/18/19 0625  BP: 139/77 128/73 122/70 108/74  Pulse: 71 61 63 66  Resp: '16 17 18 16  ' Temp:  98.1 F (36.7 C) 98.2 F (36.8 C) 98 F (36.7 C) 98.3 F (36.8 C)  TempSrc: Oral Oral Oral Oral  Weight:      Height:       General: alert, cooperative and no distress Lochia: appropriate Uterine Fundus: firm Incision: N/A DVT Evaluation: No evidence of DVT seen on physical exam. Labs: Lab Results  Component Value Date   WBC 13.8 (H) 06/18/2019   HGB 7.9 (L) 06/18/2019   HCT 24.5 (L) 06/18/2019   MCV 80.6 06/18/2019   PLT 255 06/18/2019   CMP Latest Ref Rng & Units 06/17/2019  Glucose 70 - 99 mg/dL -  BUN 6 - 20 mg/dL -  Creatinine 0.44 - 1.00 mg/dL 0.61  Sodium 135 - 145 mmol/L -  Potassium 3.5 - 5.1 mmol/L -  Chloride 98 - 111 mmol/L -  CO2 22 - 32 mmol/L -  Calcium 8.9 - 10.3 mg/dL -  Total Protein 6.5 - 8.1  g/dL -  Total Bilirubin 0.3 - 1.2 mg/dL -  Alkaline Phos 38 - 126 U/L -  AST 15 - 41 U/L -  ALT 0 - 44 U/L -    Discharge instruction: per After Visit Summary and "Baby and Me Booklet".  After visit meds:  Allergies as of 06/18/2019   No Known Allergies     Medication List    TAKE these medications   Accu-Chek FastClix Lancets Misc 1 Device by Percutaneous route 4 (four) times daily.   Accu-Chek Guide w/Device Kit 1 Device by Does not apply route 4 (four) times daily.   Blood Pressure Kit Devi 1 Device by Does not apply route as needed. ICD 10 :  O09.90   [place] 1 Units by Does not apply route daily.   glucose blood test strip Use as instructed   ibuprofen 600 MG tablet Commonly known as: ADVIL Take 1 tablet (600 mg total) by mouth every 6 (six) hours.   Iron 325 (65 Fe) MG Tabs Take 1 tablet (325 mg total) by mouth 2 (two) times daily.   OB Complete/DHA 30-10-1-200 MG Caps Take 1 capsule by mouth daily.       Diet: routine diet  Activity: Advance as tolerated. Pelvic rest for 6 weeks.   Outpatient follow up:4 weeks Follow up Appt:No future appointments. Follow up Visit:No follow-ups on file.    Please schedule this patient for Postpartum visit in: 4 weeks with the following provider: Any provider For C/S patients schedule nurse incision check in weeks 2 weeks: no High risk pregnancy complicated by: O4HTX, FGR (resolved in third trimester), COVID+ Delivery mode:  SVD Anticipated Birth Control:  other/unsure PP Procedures needed: None   Schedule Integrated BH visit: no   Postpartum contraception: Depo Provera  Newborn Data: Live born female  Birth Weight: 6 lb 11.8 oz (3056 g) APGAR: 8, 9  Newborn Delivery   Birth date/time: 06/17/2019 11:04:00 Delivery type: Vaginal, Spontaneous      Baby Feeding: Bottle and Breast Disposition:home with mother -MMR to be given at the time of discharge    Noni Saupe I, NP 06/18/2019 9:39 AM

## 2019-06-17 NOTE — MAU Note (Signed)
Pt reports ctx since 0500 today.  Pt denies LOF but reports some bloody show.  PT reports good fetal movement.

## 2019-06-17 NOTE — Progress Notes (Signed)
Called MAU spoke with Gwyneth Sprout to inform of Covid positive result from yesterday.  They are aware and discussing with MD

## 2019-06-18 ENCOUNTER — Inpatient Hospital Stay (HOSPITAL_COMMUNITY): Payer: Managed Care, Other (non HMO)

## 2019-06-18 ENCOUNTER — Inpatient Hospital Stay (HOSPITAL_COMMUNITY)
Admission: AD | Admit: 2019-06-18 | Payer: Managed Care, Other (non HMO) | Source: Home / Self Care | Admitting: Obstetrics and Gynecology

## 2019-06-18 LAB — RPR: RPR Ser Ql: NONREACTIVE

## 2019-06-18 LAB — CBC
HCT: 24.5 % — ABNORMAL LOW (ref 36.0–46.0)
Hemoglobin: 7.9 g/dL — ABNORMAL LOW (ref 12.0–15.0)
MCH: 26 pg (ref 26.0–34.0)
MCHC: 32.2 g/dL (ref 30.0–36.0)
MCV: 80.6 fL (ref 80.0–100.0)
Platelets: 255 10*3/uL (ref 150–400)
RBC: 3.04 MIL/uL — ABNORMAL LOW (ref 3.87–5.11)
RDW: 15.1 % (ref 11.5–15.5)
WBC: 13.8 10*3/uL — ABNORMAL HIGH (ref 4.0–10.5)
nRBC: 0 % (ref 0.0–0.2)

## 2019-06-18 LAB — GLUCOSE, CAPILLARY: Glucose-Capillary: 83 mg/dL (ref 70–99)

## 2019-06-18 MED ORDER — MEASLES, MUMPS & RUBELLA VAC IJ SOLR
0.5000 mL | Freq: Once | INTRAMUSCULAR | Status: AC
  Administered 2019-06-18: 16:00:00 0.5 mL via SUBCUTANEOUS
  Filled 2019-06-18: qty 0.5

## 2019-06-18 MED ORDER — IBUPROFEN 600 MG PO TABS: 600.0000 mg | ORAL_TABLET | Freq: Four times a day (QID) | ORAL | 0 refills | Status: DC

## 2019-06-18 MED ORDER — IRON 325 (65 FE) MG PO TABS: 1.0000 | ORAL_TABLET | Freq: Two times a day (BID) | ORAL | 0 refills | Status: DC

## 2019-06-18 NOTE — Lactation Note (Signed)
This note was copied from a baby's chart. Lactation Consultation Note Baby 19 hrs old at time of consult.  Mom has 34 yr old that she BF for 3 yrs and 18 month of that she BF for 3 weeks, stopped d/t low milk supply. Mom stated she had to formula feed as well d/t baby wasn't ever satisfied at the breast, wasn't able to pump much so she stopped. Mom has small breast, 2 fingers width space between breast. Everted short shaft nipples.  Shells given, mom doesn't have bra here. Mom states she will try to wear them when she gets home for a few days. Encouraged mom to put baby to the breast before giving formula. Mom stated OK. Mom encouraged to feed baby 8-12 times/24 hours and with feeding cues. Mom shown how to use DEBP & how to disassemble, clean, & reassemble parts. Mom knows to pump q3h for 15-20 min. Hand expression w/no colostrum noted. Discussed importance of pump to induce lactation. Mom feels that she has no milk. Had little change in breast during pregnancy. Newborn behavior, feeding habits, STS, I&O, breast massage, hands free bra, supply and demand discussed. Answered questions.  Lactation brochure given. Encouraged to call for assistance or questions.  Patient Name: Sierra Gamble [id number] Date: 06/18/2019 Reason for consult: Initial assessment;Term   Maternal Data Has patient been taught Hand Expression?: Yes Does the patient have breastfeeding experience prior to this delivery?: Yes  Feeding Feeding Type: Bottle Fed - Formula  LATCH Score       Type of Nipple: Everted at rest and after stimulation(short shafts)  Comfort (Breast/Nipple): Soft / non-tender        Interventions Interventions: Breast feeding basics reviewed;Breast massage;Hand express;Breast compression;DEBP;Shells  Lactation Tools Discussed/Used Tools: Pump Breast pump type: Double-Electric Breast Pump WIC Program: Yes Pump Review: Setup, frequency, and cleaning;Milk Storage Initiated by:: Allayne Stack RN IBCLC Date initiated:: 06/18/19   Consult Status Consult Status: Follow-up Date: 06/19/19 Follow-up type: In-patient    Jaser Fullen, Elta Gu[place] 06/18/2019, 4:06 AM

## 2019-06-18 NOTE — Plan of Care (Signed)
Pt is progressing well, desires early discharge.

## 2019-06-18 NOTE — Lactation Note (Signed)
This note was copied from a baby's chart. Lactation Consultation Note  Patient Name: Sierra Gamble [id number] Date: 06/18/2019 Reason for consult: Follow-up assessment;Term;Other (Comment)(early D/C per [facility])  Baby is 41 hours old / mom + Cov - isolation protocol followed upon entered the room.  Per mom baby last fed at 1215 and took 25 ml of formula. And wet diaper.  LC reviewed supply / demand and the importance of giving the baby the opportunity to latch both breast and if needed supplement.  After breast feeding if baby is satisfied hold off on the supplementing if not  Supplement with EBM or formula.  Post pump both breast for 15 -20 mins / save milk and hand express feed back to baby. Storage of breast milk reviewed.  Per mom has a DEBP at home.  Feeding goals for 24 hours is to feed with cues and 8-12 times a day.  LC recommended checking with her Pedis group for [facility] services and if not available to call [facility] for [facility] O/P appointment for about 1 week PP to give the milk a chance to establish.  Mom has the [facility] pamphlet with phone number.  Mom receptive to [facility] discussion.  LC unable to check a latch the baby had recently been fed and sound asleep.    Maternal Data    Feeding Feeding Type: (per mom baby last fed at 1215 pm) Nipple Type: Slow - flow  LATCH Score                   Interventions Interventions: Breast feeding basics reviewed  Lactation Tools Discussed/Used Tools: Pump(already set up by the 11-7 p LC) Breast pump type: Double-Electric Breast Pump Pump Review: Milk Storage   Consult Status Consult Status: Complete Date: 06/18/19    Myer Haff 06/18/2019, 2:23 PM

## 2019-06-18 NOTE — Discharge Instructions (Signed)

## 2019-06-20 ENCOUNTER — Encounter: Payer: Self-pay | Admitting: *Deleted

## 2019-06-27 ENCOUNTER — Other Ambulatory Visit: Payer: Self-pay

## 2019-06-27 ENCOUNTER — Ambulatory Visit (INDEPENDENT_AMBULATORY_CARE_PROVIDER_SITE_OTHER): Payer: Managed Care, Other (non HMO)

## 2019-06-27 DIAGNOSIS — O165 Unspecified maternal hypertension, complicating the puerperium: Secondary | ICD-10-CM

## 2019-06-27 DIAGNOSIS — Z0130 Encounter for examination of blood pressure without abnormal findings: Secondary | ICD-10-CM

## 2019-06-27 MED ORDER — AMLODIPINE BESYLATE 10 MG PO TABS: 10.0000 mg | ORAL_TABLET | Freq: Every day | ORAL | 0 refills | Status: DC

## 2019-06-27 NOTE — Progress Notes (Addendum)
I connected with  Sierra Gamble on 06/27/19 at 1500 by telephone and verified that I am speaking with the correct person using two identifiers. MyChart text invitation sent. Connection too poor to complete visit by MyChart. Called pt by telephone.  Virtual appt today to check BP s/p vaginal delivery on 06/17/19. Pt has no prior hx of htn and is not on any htn meds. Pt took BP with regular cuff at 1447: 155/107, rechecked with large cuff at 1455: 148/93, rechecked with large cuff at 1543 158/86. Pt denies any headache, blurry vision, dizziness, swelling, or SOB.  BPs and hx reviewed with Earlie Server, NP. Verbal order for Norvasc 10 mg daily. Pt to return in 1 week for in office BP check. Reviewed provider recommendation with pt. Pt states she has a nurse visit appt on 07/06/19 and is not available earlier in the week. Pt will keep that appt and call if she experiences any symptoms. Pt states she will pick up Norvasc today and begin taking.  Annabell Howells, RN 06/27/2019  3:27 PM    Chart reviewed for nurse visit. Agree with plan of care.   Virginia Rochester, NP 06/27/2019 8:58 PM

## 2019-07-05 ENCOUNTER — Encounter: Payer: Self-pay | Admitting: *Deleted

## 2019-07-06 ENCOUNTER — Ambulatory Visit (INDEPENDENT_AMBULATORY_CARE_PROVIDER_SITE_OTHER): Payer: Managed Care, Other (non HMO)

## 2019-07-06 ENCOUNTER — Other Ambulatory Visit: Payer: Self-pay

## 2019-07-06 VITALS — BP 126/90 | HR 70 | Wt 142.4 lb

## 2019-07-06 DIAGNOSIS — Z0130 Encounter for examination of blood pressure without abnormal findings: Secondary | ICD-10-CM

## 2019-07-06 NOTE — Progress Notes (Signed)
Pt here today for BP check s/p elevated BP from virtual visit on 06/27/19 and the start of Norvasc 10 mg tablet once daily.  Pt denies visual disturbances and headaches.  BP RA 130/90 recheck BP LA 126/90.  Pt reports last dose of Norvasc was yesterday in the evening.  Pt states that is when she takes it .  Pt was advised to bring BP cuff to visit to validate proper use of cuff.  Pt demonstrated the use of cuff properly.  Notified Noni Saupe, NP who recommends that pt continue to monitor for sx's of HTN and to notify the office if BP reading is outside of parameters.  Notified pt provider's recommendation and to continue taking Norvasc in which she will be evaluated at her pp visit.  Pt verbalized understanding.

## 2019-07-07 NOTE — Progress Notes (Signed)
Chart reviewed for nurse visit. Agree with plan of care.   Noni Saupe I, NP 07/07/2019 8:45 AM

## 2019-07-20 ENCOUNTER — Ambulatory Visit (INDEPENDENT_AMBULATORY_CARE_PROVIDER_SITE_OTHER): Payer: BC Managed Care – PPO | Admitting: Obstetrics and Gynecology

## 2019-07-20 ENCOUNTER — Other Ambulatory Visit: Payer: Self-pay

## 2019-07-20 ENCOUNTER — Other Ambulatory Visit: Payer: Self-pay | Admitting: Lactation Services

## 2019-07-20 DIAGNOSIS — Z3202 Encounter for pregnancy test, result negative: Secondary | ICD-10-CM

## 2019-07-20 DIAGNOSIS — Z1389 Encounter for screening for other disorder: Secondary | ICD-10-CM

## 2019-07-20 DIAGNOSIS — Z3042 Encounter for surveillance of injectable contraceptive: Secondary | ICD-10-CM | POA: Diagnosis not present

## 2019-07-20 DIAGNOSIS — O24410 Gestational diabetes mellitus in pregnancy, diet controlled: Secondary | ICD-10-CM

## 2019-07-20 LAB — POCT PREGNANCY, URINE: Preg Test, Ur: NEGATIVE

## 2019-07-20 MED ORDER — MEDROXYPROGESTERONE ACETATE 150 MG/ML IM SUSP
150.0000 mg | Freq: Once | INTRAMUSCULAR | Status: AC
  Administered 2019-07-20: 150 mg via INTRAMUSCULAR

## 2019-07-20 NOTE — Addendum Note (Signed)
Addended by: Louisa Second E on: 07/20/2019 11:56 AM   Modules accepted: Orders

## 2019-07-20 NOTE — Progress Notes (Signed)
Subjective:     Sierra Gamble is a 34 y.o. female who presents for a postpartum visit. She is 4 weeks postpartum following a SVD. I have fully reviewed the prenatal and intrapartum course. The delivery was at 39.3 gestational weeks. Outcome: spontaneous vaginal delivery. Anesthesia: none. Postpartum course has been unremarkable. Baby's course has been unremarkable. Baby is feeding by bottle - gerber. Bleeding no bleeding. Bowel function is abnormal: constipation. Bladder function is normal. Patient is not sexually active. Contraception method is Depo-Provera injections. Postpartum depression screening: negative.  The following portions of the patient's history were reviewed and updated as appropriate: allergies, current medications, past family history, past medical history, past social history, past surgical history and problem list.  Review of Systems Pertinent items are noted in HPI.   Objective:    BP (!) 127/91   Pulse 74   Wt 141 lb 9.6 oz (64.2 kg)   LMP 09/14/2018 (Exact Date)   BMI 26.76 kg/m   General:  alert  Lungs: clear to auscultation bilaterally  Heart:  regular rate and rhythm, S1, S2 normal, no murmur, click, rub or gallop  Abdomen: soft, non-tender; bowel sounds normal; no masses,  no organomegaly        Assessment:   Normal postpartum exam. Pap smear not done at today's visit.  last pap with colpo 11/2018 normal.   Plan:   1. Contraception: Depo-Provera injections 2. Needs 2 hour GTT, not fasting today.  3. Follow up For 2 hour GTT' 4. BP recheck 115/79  Noni Saupe I, NP 07/20/2019 10:20 AM

## 2019-07-25 ENCOUNTER — Other Ambulatory Visit: Payer: Self-pay

## 2019-07-25 ENCOUNTER — Other Ambulatory Visit: Payer: BC Managed Care – PPO

## 2019-07-25 DIAGNOSIS — O24410 Gestational diabetes mellitus in pregnancy, diet controlled: Secondary | ICD-10-CM

## 2019-07-26 LAB — GLUCOSE TOLERANCE, 2 HOURS
Glucose, 2 hour: 92 mg/dL (ref 65–139)
Glucose, GTT - Fasting: 85 mg/dL (ref 65–99)

## 2019-10-12 ENCOUNTER — Encounter: Payer: Self-pay | Admitting: *Deleted

## 2019-11-09 ENCOUNTER — Other Ambulatory Visit: Payer: Self-pay | Admitting: Family Medicine

## 2020-02-20 ENCOUNTER — Other Ambulatory Visit: Payer: Self-pay | Admitting: Family Medicine

## 2020-02-20 DIAGNOSIS — N939 Abnormal uterine and vaginal bleeding, unspecified: Secondary | ICD-10-CM

## 2020-03-26 ENCOUNTER — Ambulatory Visit
Admission: RE | Admit: 2020-03-26 | Discharge: 2020-03-26 | Disposition: A | Discharge status: Home / Self Care | Payer: BC Managed Care – PPO | Source: Ambulatory Visit | Attending: Family Medicine | Admitting: Family Medicine

## 2020-03-26 DIAGNOSIS — N939 Abnormal uterine and vaginal bleeding, unspecified: Secondary | ICD-10-CM

## 2020-11-14 ENCOUNTER — Other Ambulatory Visit: Payer: Self-pay | Admitting: Family Medicine

## 2020-11-14 DIAGNOSIS — D241 Benign neoplasm of right breast: Secondary | ICD-10-CM

## 2020-12-26 ENCOUNTER — Other Ambulatory Visit: Payer: Self-pay | Admitting: Family Medicine

## 2020-12-26 ENCOUNTER — Ambulatory Visit
Admission: RE | Admit: 2020-12-26 | Discharge: 2020-12-26 | Disposition: A | Discharge status: Home / Self Care | Payer: BC Managed Care – PPO | Source: Ambulatory Visit | Attending: Family Medicine | Admitting: Family Medicine

## 2020-12-26 ENCOUNTER — Other Ambulatory Visit: Payer: Self-pay

## 2020-12-26 DIAGNOSIS — D241 Benign neoplasm of right breast: Secondary | ICD-10-CM

## 2021-01-06 ENCOUNTER — Ambulatory Visit
Admission: RE | Admit: 2021-01-06 | Discharge: 2021-01-06 | Disposition: A | Discharge status: Home / Self Care | Payer: BC Managed Care – PPO | Source: Ambulatory Visit | Attending: Family Medicine | Admitting: Family Medicine

## 2021-01-06 ENCOUNTER — Other Ambulatory Visit: Payer: Self-pay

## 2021-01-06 DIAGNOSIS — D241 Benign neoplasm of right breast: Secondary | ICD-10-CM

## 2021-02-26 IMAGING — US US MFM OB FOLLOW-UP
1 series · 13 of 28 positions shown · non-contrast
Comparison: none

[Series 1: us mfm ob follow-up · 13 of 39 slices shown]
[im 2/39]
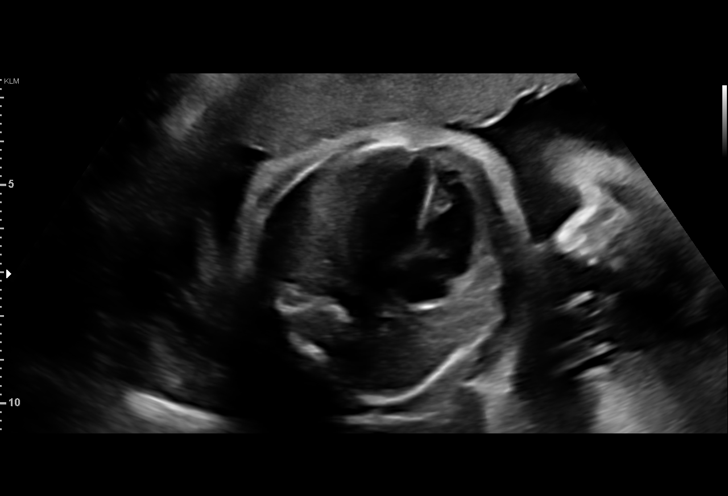
[im 5/39]
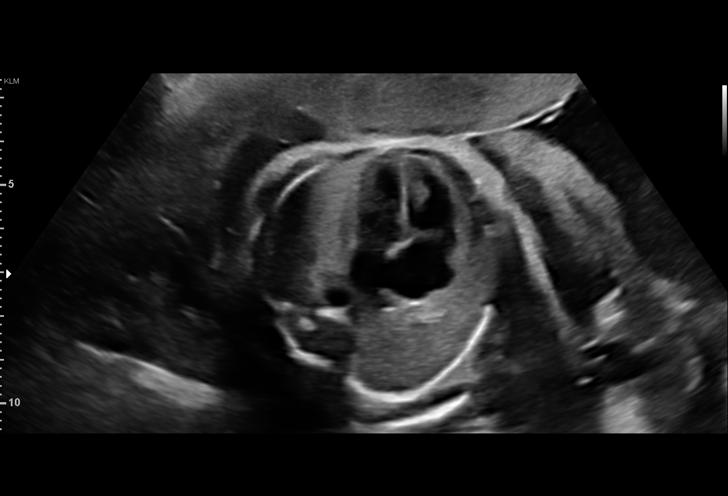
[im 8/39]
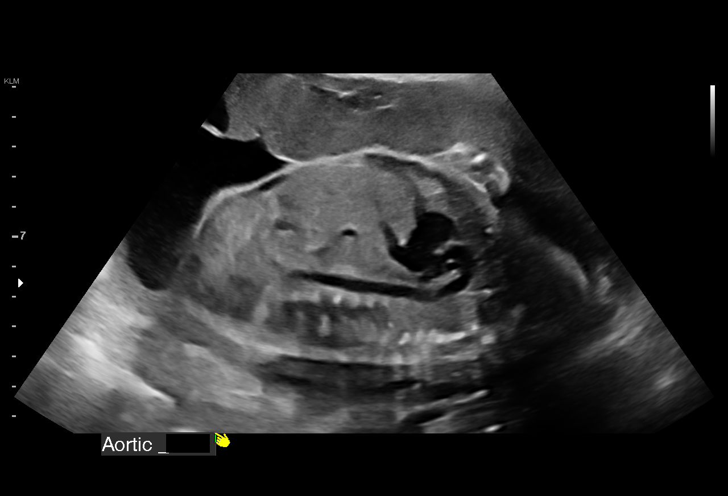
[im 10/39]
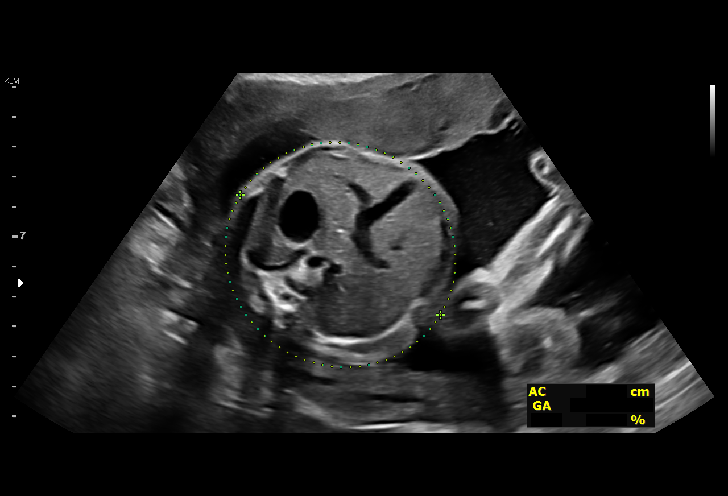
[im 13/39]
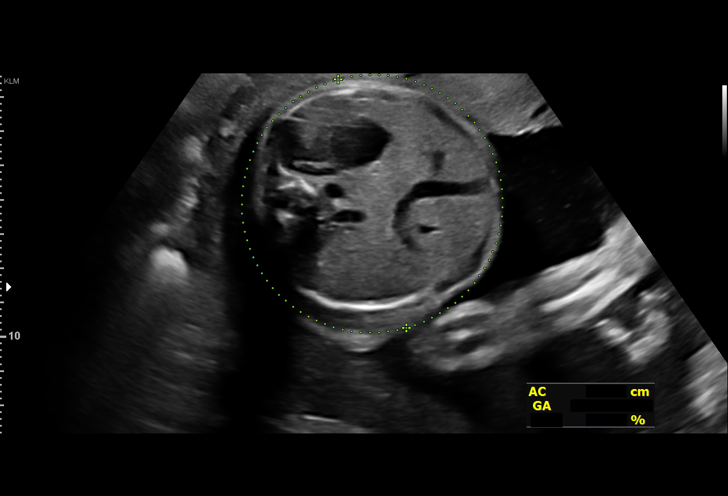
[im 16/39]
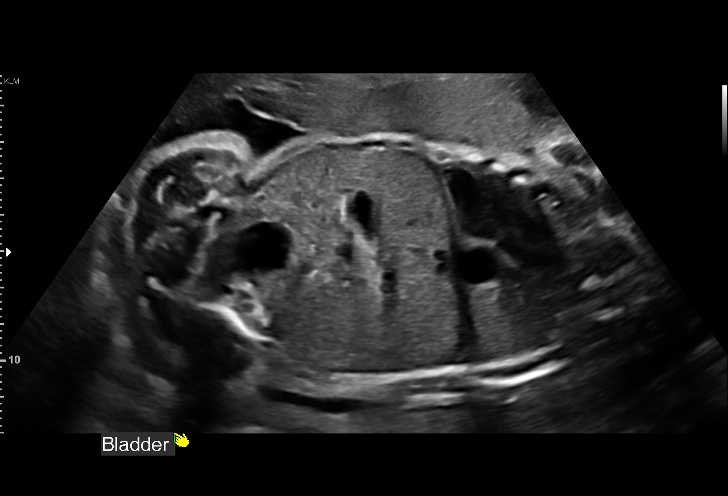
[im 20/39]
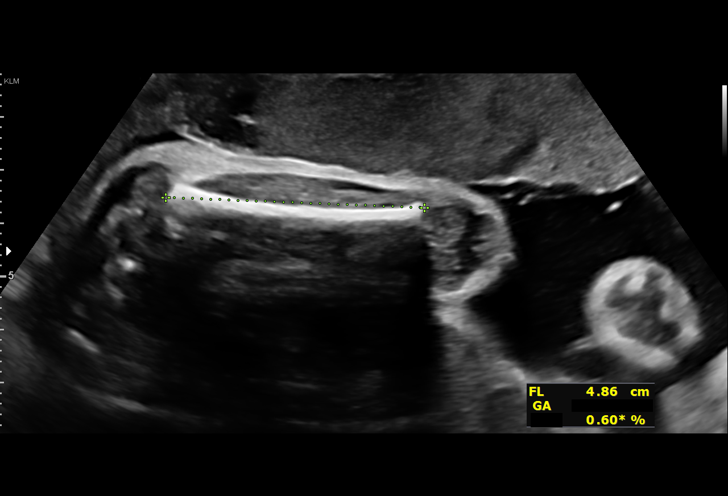
[im 23/39]
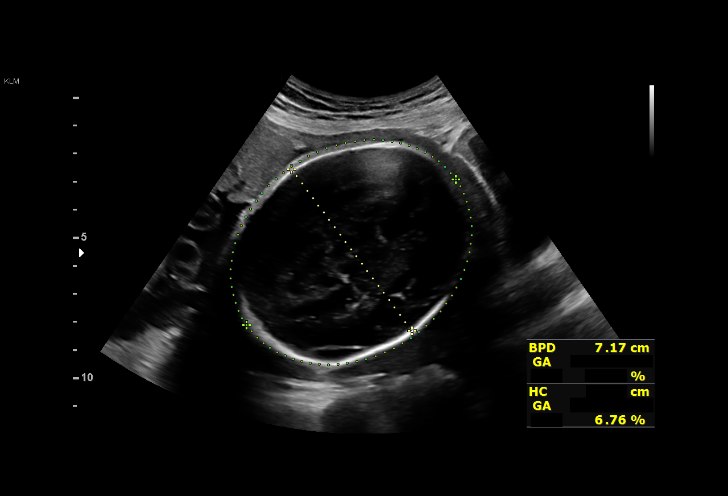
[im 26/39]
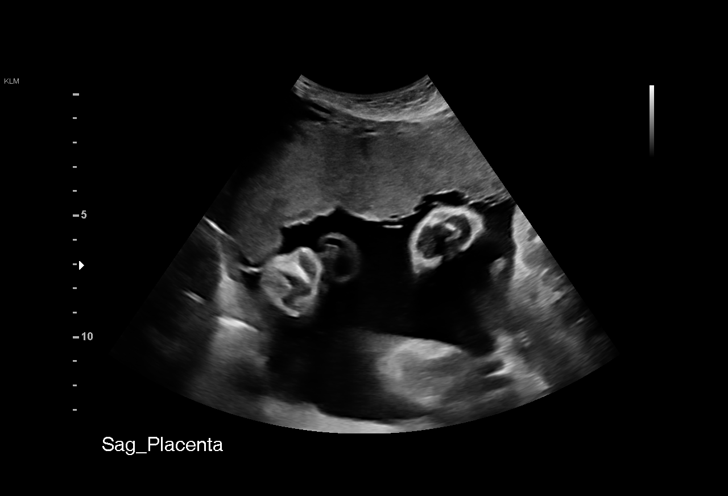
[im 29/39]
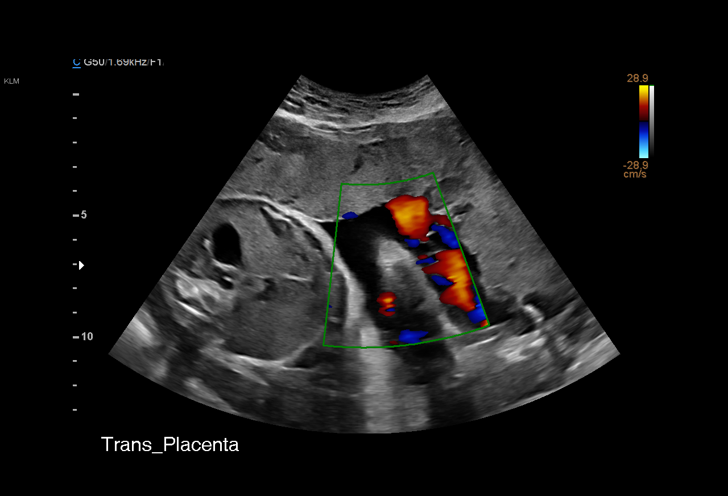
[im 31/39]
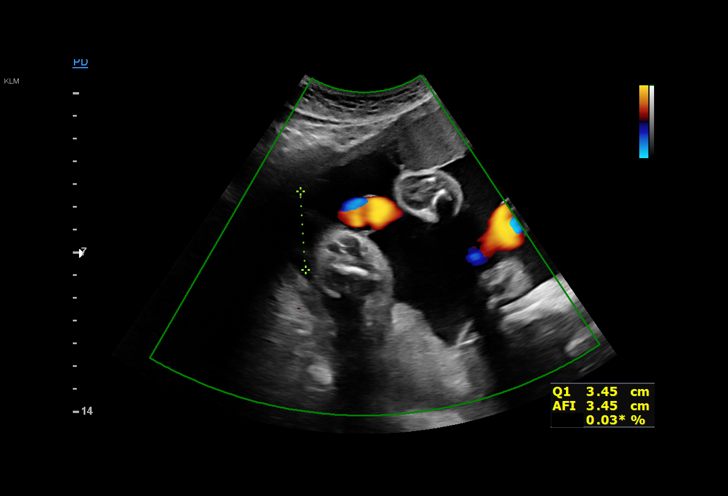
[im 34/39]
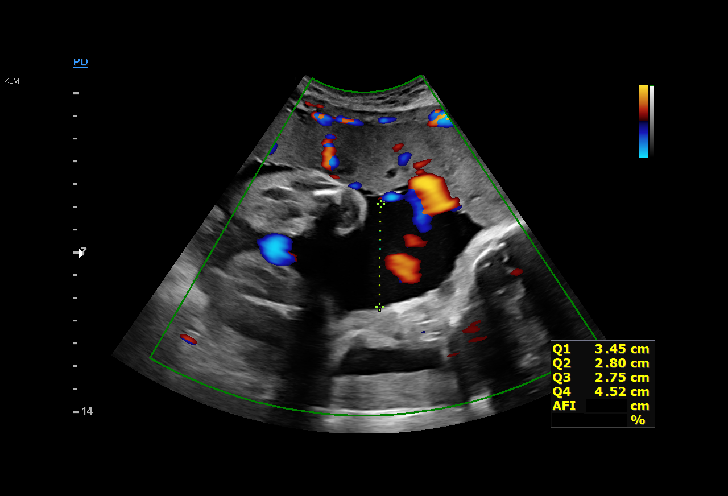
[im 37/39]
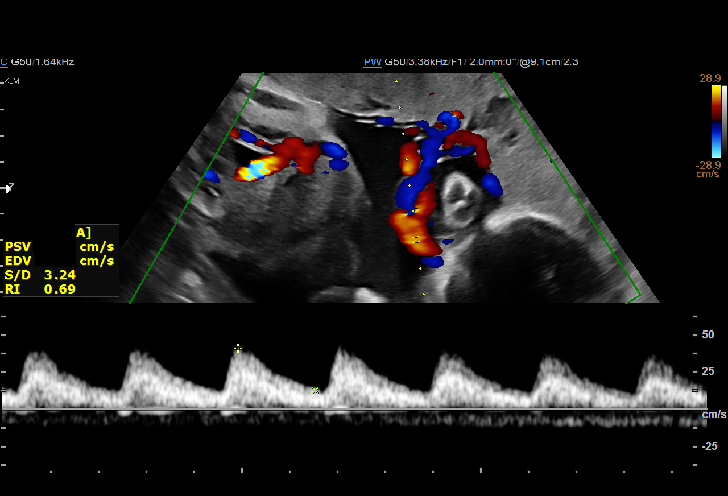

[13 of 28 positions shown; findings below may reference images not displayed]

ALDO
  2  US MFM UA CORD DOPPLER               76820.02     BRILLIT
                                                       ALDO
 ----------------------------------------------------------------------

 ----------------------------------------------------------------------
Indications

  Gestational diabetes in pregnancy, diet
  controlled (new DX)
  29 weeks gestation of pregnancy
  Poor obstetric history (prior pre-term labor @
  75wks)
  Genetic carrier (Carrier for Hemoglobin E)
  Anemia during pregnancy in third trimester
 ----------------------------------------------------------------------
Vital Signs

                                                Height:        5'1"
Fetal Evaluation

 Num Of Fetuses:         1
 Fetal Heart Rate(bpm):  129
 Cardiac Activity:       Observed
 Presentation:           Cephalic
 Placenta:               Anterior
 P. Cord Insertion:      Visualized, central

 Amniotic Fluid
 AFI FV:      Within normal limits

 AFI Sum(cm)     %Tile       Largest Pocket(cm)
 13.52           42

 RUQ(cm)       RLQ(cm)       LUQ(cm)        LLQ(cm)

Biometry

 BPD:      71.8  mm     G. Age:  28w 6d         32  %    CI:        74.42   %    70 - 86
                                                         FL/HC:      18.7   %    19.6 -
 HC:      264.2  mm     G. Age:  28w 5d         13  %    HC/AC:      1.12        0.99 -
 AC:      236.7  mm     G. Age:  28w 0d         16  %    FL/BPD:     68.9   %    71 - 87
 FL:       49.5  mm     G. Age:  26w 5d          1  %    FL/AC:      20.9   %    20 - 24

 Est. FW:    0086  gm      2 lb 7 oz      6  %
OB History

 Gravidity:    4         Term:   2        Prem:   1        SAB:   1
 TOP:          0       Ectopic:  0        Living: 2
Gestational Age

 LMP:           29w 0d        Date:  09/14/18                 EDD:   06/21/19
 U/S Today:     28w 1d                                        EDD:   06/27/19
 Best:          29w 0d     Det. By:  LMP  (09/14/18)          EDD:   06/21/19
Anatomy

 Cranium:               Appears normal         LVOT:                   Appears normal
 Cavum:                 Previously seen        Aortic Arch:            Appears normal
 Ventricles:            Appears normal         Ductal Arch:            Appears normal
 Choroid Plexus:        Previously seen        Diaphragm:              Appears normal
 Cerebellum:            Previously seen        Stomach:                Appears normal, left
                                                                       sided
 Posterior Fossa:       Previously seen        Abdomen:                Appears normal
 Nuchal Fold:           Previously seen        Abdominal Wall:         Previously seen
 Face:                  Orbits and profile     Cord Vessels:           Previously seen
                        previously seen
 Lips:                  Previously seen        Kidneys:                Appear normal
 Palate:                Previously seen        Bladder:                Previously seen
 Thoracic:              Appears normal         Spine:                  Previously seen
 Heart:                 Appears normal         Upper Extremities:      Previously seen
                        (4CH, axis, and
                        situs)
 RVOT:                  Appears normal         Lower Extremities:      Previously seen

 Other:  Heels and 5th digit previously visualized. Nasal bone previously
         visualized. Fetus appears to be female.
Doppler - Fetal Vessels

 Umbilical Artery
  S/D     %tile                                            ADFV    RDFV
 3.36       73                                                No      No

Cervix Uterus Adnexa

 Cervix
 Not visualized (advanced GA >64wks)
Impression

 Patient return for fetal growth assessment.  She has a new
 diagnosis of gestational diabetes and the patient will be
 meeting with the diabetic educator.
 On ultrasound the estimated fetal weight is at the 6th
 percentile.  Femur length measurements lag gestational age
 by 2 weeks.  Amniotic fluid is normal and good fetal activity
 seen.  Umbilical artery Doppler showed normal forward
 diastolic flow.
 Patient reports her previous children weighed between 5 and
 6 pounds at birth.

 I explained the findings consistent with fetal growth
 restriction.  I also informed that it is more likely a
 constitutionally small baby.  However we recommend close
 fetal surveillance.
Recommendations

 -An appointment was made for her to return in 2 weeks for
 umbilical artery Doppler study and NST.
 -Fetal growth assessment, BPP, UA Doppler and NST in 3
 weeks.
                 Alnajjar, Franndy

## 2021-04-16 IMAGING — US US MFM UA CORD DOPPLER
1 series · 15 of 21 positions shown · non-contrast
Comparison: none

[Series 1: us mfm ua cord doppler · 21 acquisitions, 15 frames shown]
[im 1/21]
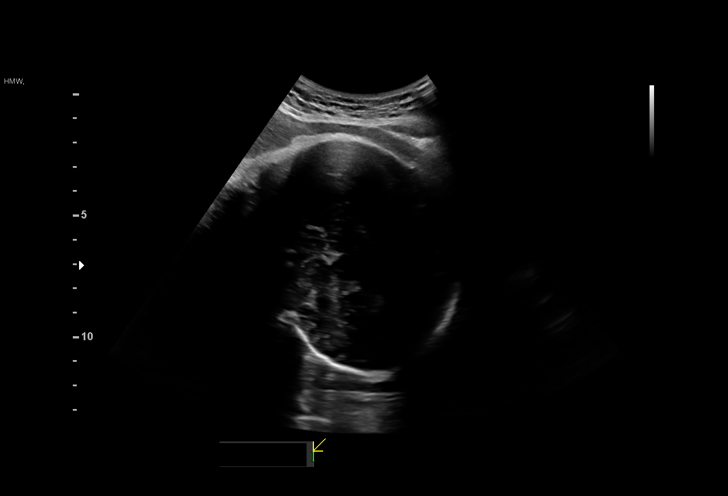
[im 3/21]
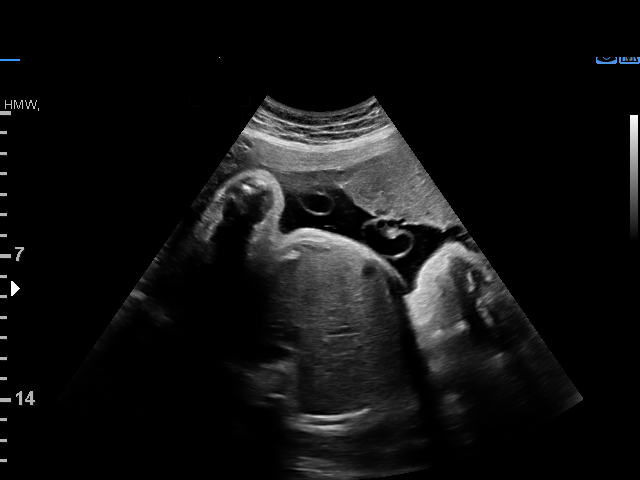
[im 4/21]
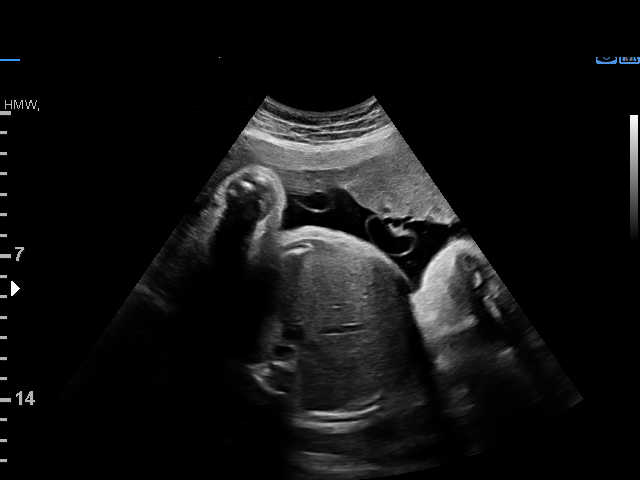
[im 5/21]
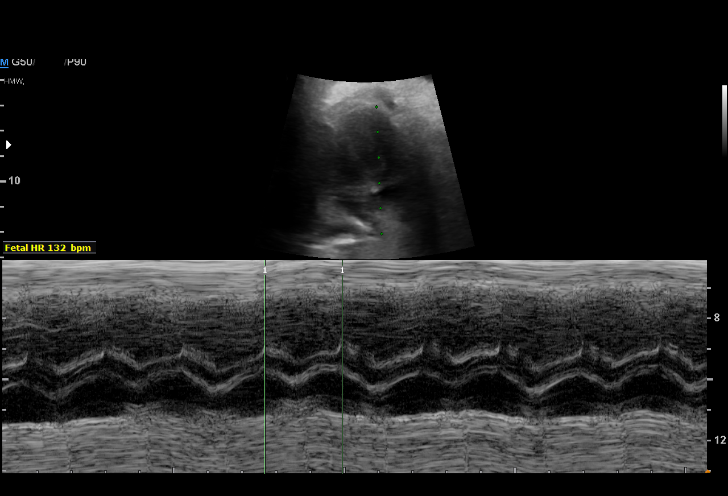
[im 7/21]
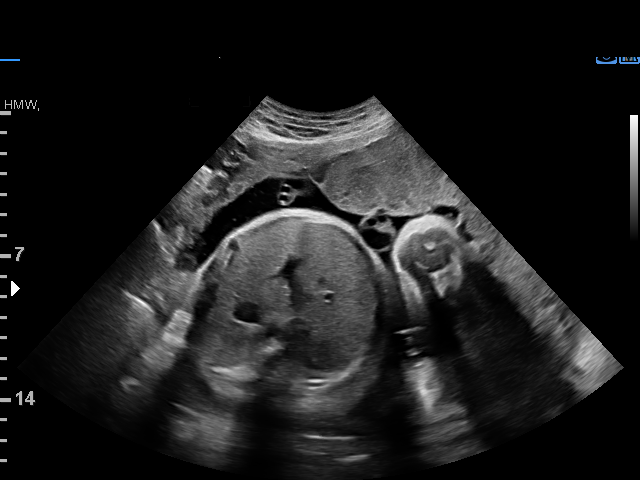
[im 8/21]
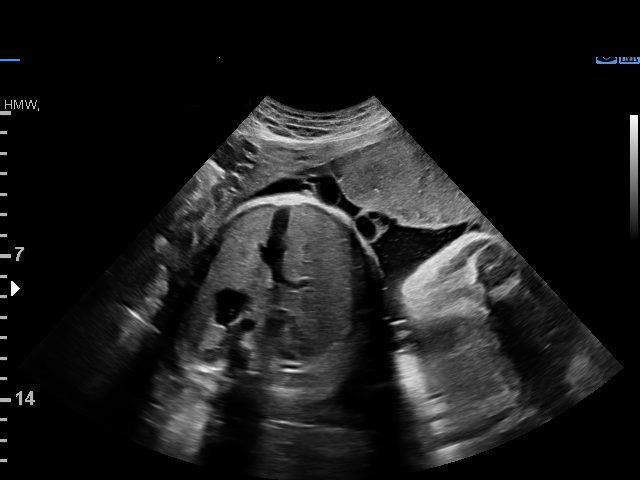
[im 10/21]
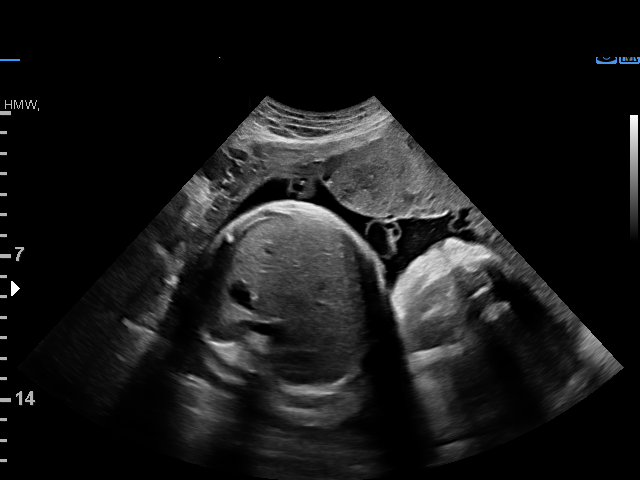
[im 11/21]
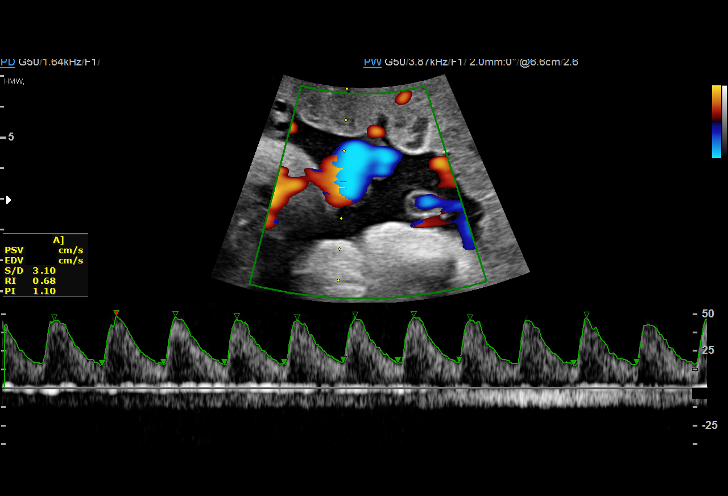
[im 12/21]
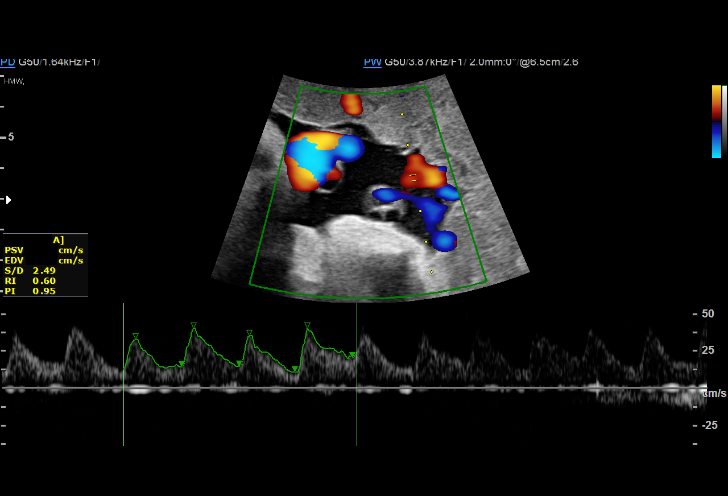
[im 14/21]
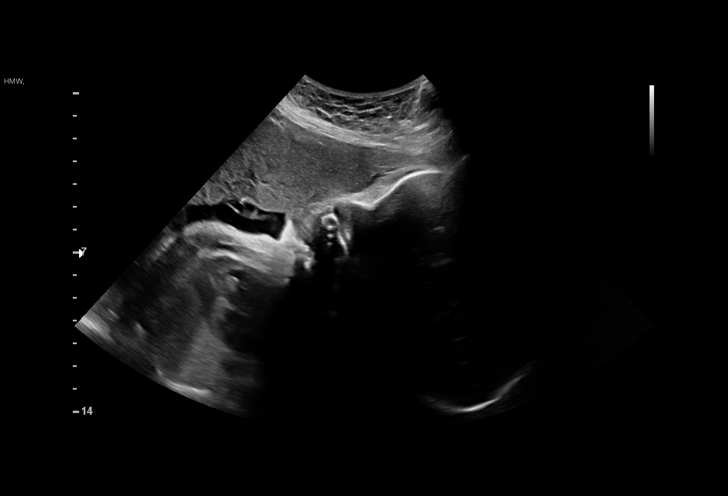
[im 15/21]
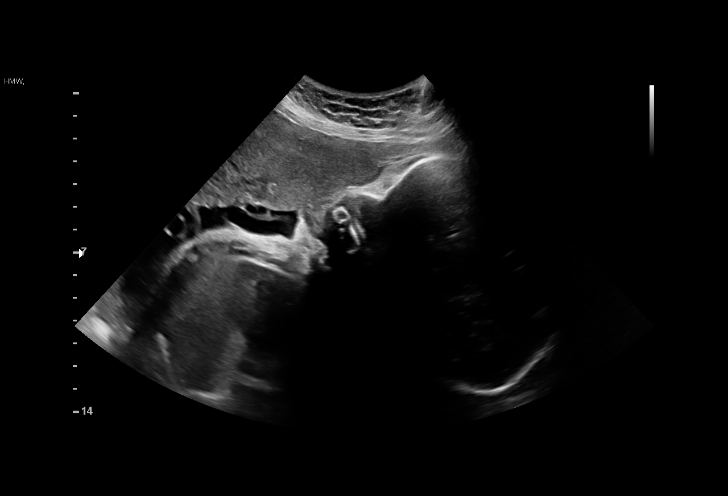
[im 17/21]
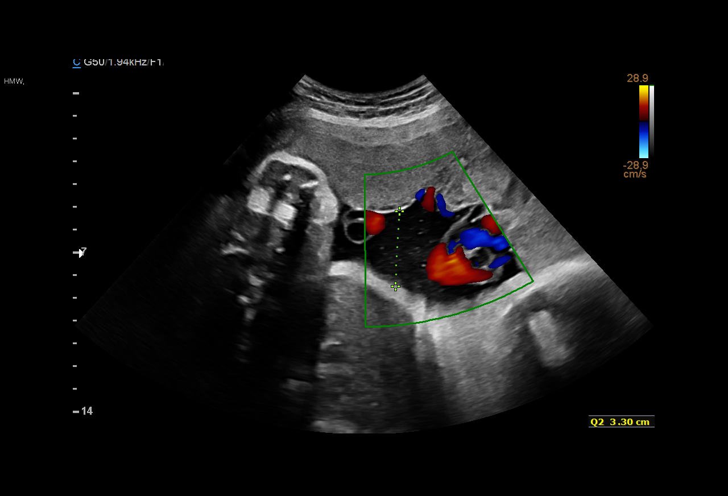
[im 18/21]
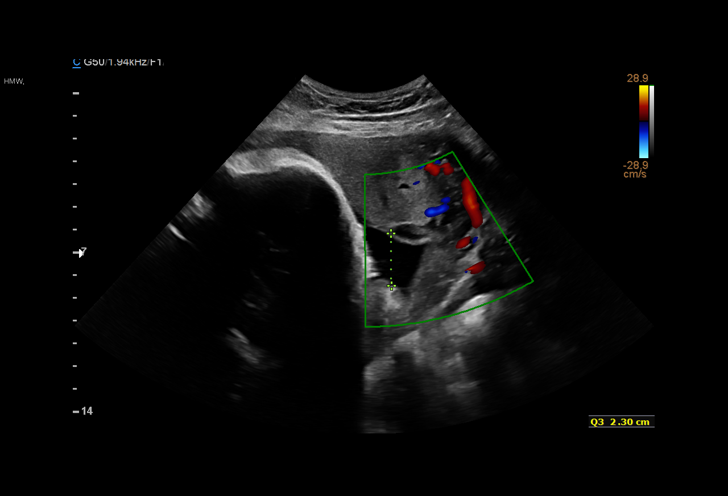
[im 19/21]
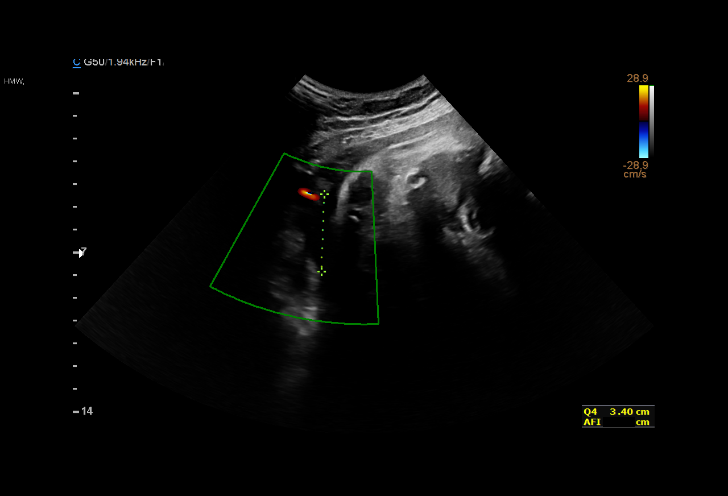
[im 21/21]
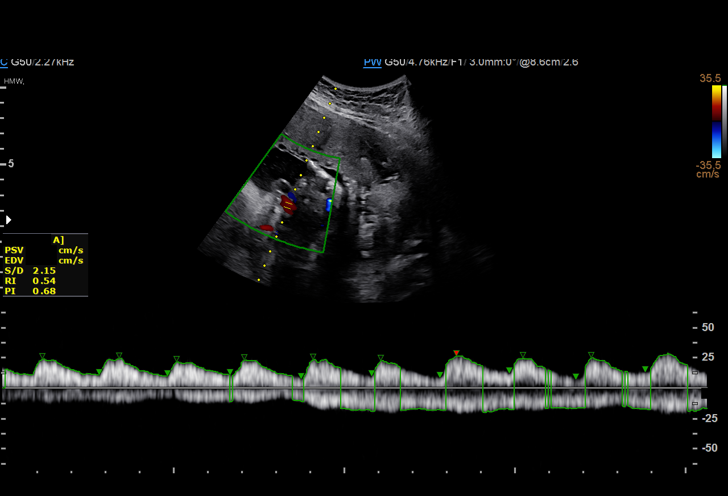

[15 of 21 positions shown; findings below may reference images not displayed]

Suite A
                   Suite A

 ----------------------------------------------------------------------

 ----------------------------------------------------------------------
Indications

  Maternal care for known or suspected poor
  fetal growth, third trimester, fetus 1 IUGR
  Gestational diabetes in pregnancy, diet
  controlled
  Poor obstetric history (prior pre-term labor @
  50wks)
  Anemia during pregnancy in second trimester
  Genetic carrier (Carrier for Hemoglobin E)
  Low Risk NIPS
  36 weeks gestation of pregnancy
 ----------------------------------------------------------------------
Vital Signs

                                                Height:        5'1"
Fetal Evaluation

 Num Of Fetuses:         1
 Fetal Heart Rate(bpm):  132
 Cardiac Activity:       Observed
 Presentation:           Cephalic

 Amniotic Fluid
 AFI FV:      Within normal limits

 AFI Sum(cm)     %Tile       Largest Pocket(cm)
 12.47           40
 RUQ(cm)       RLQ(cm)       LUQ(cm)        LLQ(cm)

Biophysical Evaluation

 Amniotic F.V:   Within normal limits       F. Tone:        Observed
 F. Movement:    Observed                   Score:          [DATE]
 F. Breathing:   Observed
OB History

 Gravidity:    4         Term:   2        Prem:   1        SAB:   1
 TOP:          0       Ectopic:  0        Living: 2
Gestational Age

 LMP:           36w 0d        Date:  09/14/18                 EDD:   06/21/19
 Best:          36w 0d     Det. By:  LMP  (09/14/18)          EDD:   06/21/19
Doppler - Fetal Vessels

 Umbilical Artery
  S/D     %tile     RI                                     ADFV    RDFV
 2.49       56     0.6                                        No      No

Comments

 This patient was seen due to an IUGR fetus.  The fetal growth
 ultrasound she had last week indicated that the fetal growth
 was appropriate for her gestational age.  She denies any
 problems since her last exam.
 A biophysical profile performed today was [DATE].
 There was normal amniotic fluid noted on today's ultrasound
 exam.
 Doppler studies of the umbilical arteries performed due to
 fetal growth restriction showed a normal S/D ratio of 2.49.
 A nonstress test was scheduled for her in 1 week.

## 2022-06-29 NOTE — L&D Delivery Note (Cosign Needed Addendum)
OB/GYN Faculty Practice Delivery Note  Sierra Gamble is a 37 y.o. G4W1027 s/p SVD at [redacted]w[redacted]d. She was admitted for spontaneous labor.   ROM: 1h 57m with moderate meconium fluid GBS Status:  Negative/-- (07/02 1433) Maximum Maternal Temperature: 97.8 F  Labor Progress: Initial SVE: 9/90/-1. She then progressed to complete.   Delivery Date/Time: 01/19/2023 at [phone] Delivery: Called to room and patient was complete and pushing. Patient pushing in hands and knees. Head delivered OA with compound hand. One loose nuchal/body cord present, delivered through. Shoulder and body delivered in usual fashion. Infant with spontaneous cry, placed on mother's abdomen, dried and stimulated. Cord clamped x 2 after 1-minute delay, and cut by father. Cord blood drawn. Placenta delivered spontaneously with gentle cord traction. Fundus firm with massage and Pitocin. Labia, perineum, vagina, and cervix inspected with no lacerations noted.  Baby Weight: pending  Placenta: 3 vessel, intact. Marginal insertion and velamentous insertion. Sent to L&D Complications: None Lacerations:  EBL: 50 mL Analgesia: Natural childbirth   Infant:  APGAR (1 MIN): 9  APGAR (5 MINS): 9    Glee Arvin, MD FM PGY-3 01/19/2023, 9:56 AM  GME ATTESTATION:  I saw and evaluated the patient. I agree with the findings and the plan of care as documented in the resident's note. I have made changes to documentation as necessary.  Lavonda Jumbo, DO OB Fellow, Faculty [facility], Center for [facility] 01/19/2023, 10:33 AM

## 2022-07-07 ENCOUNTER — Encounter (HOSPITAL_COMMUNITY): Payer: Self-pay | Admitting: *Deleted

## 2022-07-07 ENCOUNTER — Inpatient Hospital Stay (HOSPITAL_COMMUNITY)
Admission: AD | Admit: 2022-07-07 | Discharge: 2022-07-07 | Disposition: A | Discharge status: Home / Self Care | Payer: No Typology Code available for payment source | Attending: Obstetrics and Gynecology | Admitting: Obstetrics and Gynecology

## 2022-07-07 ENCOUNTER — Other Ambulatory Visit: Payer: Self-pay

## 2022-07-07 DIAGNOSIS — O2341 Unspecified infection of urinary tract in pregnancy, first trimester: Secondary | ICD-10-CM

## 2022-07-07 DIAGNOSIS — O09521 Supervision of elderly multigravida, first trimester: Secondary | ICD-10-CM | POA: Diagnosis not present

## 2022-07-07 DIAGNOSIS — R319 Hematuria, unspecified: Secondary | ICD-10-CM | POA: Diagnosis not present

## 2022-07-07 DIAGNOSIS — N390 Urinary tract infection, site not specified: Secondary | ICD-10-CM | POA: Diagnosis not present

## 2022-07-07 DIAGNOSIS — R300 Dysuria: Secondary | ICD-10-CM | POA: Diagnosis not present

## 2022-07-07 DIAGNOSIS — Z79899 Other long term (current) drug therapy: Secondary | ICD-10-CM | POA: Insufficient documentation

## 2022-07-07 DIAGNOSIS — Z3A10 10 weeks gestation of pregnancy: Secondary | ICD-10-CM | POA: Diagnosis not present

## 2022-07-07 DIAGNOSIS — O26891 Other specified pregnancy related conditions, first trimester: Secondary | ICD-10-CM | POA: Insufficient documentation

## 2022-07-07 LAB — URINALYSIS, ROUTINE W REFLEX MICROSCOPIC
Bilirubin Urine: NEGATIVE
Glucose, UA: NEGATIVE mg/dL
Ketones, ur: NEGATIVE mg/dL
Nitrite: NEGATIVE
Protein, ur: NEGATIVE mg/dL
Specific Gravity, Urine: 1.001 — ABNORMAL LOW (ref 1.005–1.030)
pH: 6 (ref 5.0–8.0)

## 2022-07-07 LAB — POCT PREGNANCY, URINE: Preg Test, Ur: POSITIVE — AB

## 2022-07-07 MED ORDER — CEFADROXIL 500 MG PO CAPS: 500.0000 mg | ORAL_CAPSULE | Freq: Two times a day (BID) | ORAL | 0 refills | Status: DC

## 2022-07-07 MED ORDER — PHENAZOPYRIDINE HCL 200 MG PO TABS: 200.0000 mg | ORAL_TABLET | Freq: Three times a day (TID) | ORAL | 0 refills | Status: DC

## 2022-07-07 NOTE — MAU Note (Signed)
Sierra Gamble is a 37 y.o. at Unknown here in MAU reporting: she's having pain with urination, frequency and urgency that began today.  Denies VB, had pinkish discharge with wiping once today but not recently.  Also c/o intermittent pelvic pain. LMP: 04/22/2022 Onset of complaint: today Pain score: 0 Vitals:   07/07/22 1638  BP: 115/83  Pulse: 83  Resp: 18  Temp: 98.4 F (36.9 C)  SpO2: 100%     FHT:166 bpm Lab orders placed from triage:   UPT & UA

## 2022-07-07 NOTE — MAU Provider Note (Signed)
History     CSN: [id number]  Arrival date and time: 07/07/22 1607    Chief Complaint  Patient presents with   Dysuria   Pelvic Pain   37 y.o. [id number] '@10'$ .6 wks presenting with urinary sx. Reports dysuria, urgency, and frequency that started today. Denies hematuria. States this happenend another time earlier in the pregnancy and she did not seek evaluation. Denies back pain, fever, chills, or vomiting. Reports 1 episode of LAP earlier today.    OB History     Gravida  5   Para  3   Term  2   Preterm  1   AB  1   Living  3      SAB  1   IAB      Ectopic      Multiple  0   Live Births  3           Past Medical History:  Diagnosis Date   Anemia    Gestational diabetes     Past Surgical History:  Procedure Laterality Date   NO PAST SURGERIES      Family History  Problem Relation Age of Onset   Thyroid disease Mother    Stroke Mother    Hyperlipidemia Mother    Diabetes Maternal Grandmother    Thyroid disease Maternal Grandmother     Social History   Tobacco Use   Smoking status: Never   Smokeless tobacco: Never  Vaping Use   Vaping Use: Never used  Substance Use Topics   Alcohol use: No   Drug use: No    Allergies: No Known Allergies  Medications Prior to Admission  Medication Sig Dispense Refill Last Dose   Accu-Chek FastClix Lancets MISC 1 Device by Percutaneous route 4 (four) times daily. (Patient not taking: Reported on 07/06/2019) 100 each 12    amLODipine (NORVASC) 10 MG tablet Take 1 tablet (10 mg total) by mouth daily. 30 tablet 0    Blood Glucose Monitoring Suppl (ACCU-CHEK GUIDE) w/Device KIT 1 Device by Does not apply route 4 (four) times daily. (Patient not taking: Reported on 07/06/2019) 1 kit 0    Blood Pressure Monitoring (BLOOD PRESSURE KIT) DEVI 1 Device by Does not apply route as needed. ICD 10 :  O09.90 (Patient not taking: Reported on 07/20/2019) 1 Device 0    Elastic Bandages & Supports (COMFORT FIT MATERNITY SUPP MED)  MISC 1 Units by Does not apply route daily. (Patient not taking: Reported on 05/12/2019) 1 each 0    Ferrous Sulfate (IRON) 325 (65 Fe) MG TABS Take 1 tablet (325 mg total) by mouth 2 (two) times daily. (Patient not taking: Reported on 07/06/2019) 60 tablet 0    glucose blood test strip Use as instructed (Patient not taking: Reported on 07/06/2019) 100 each 12    ibuprofen (ADVIL) 600 MG tablet Take 1 tablet (600 mg total) by mouth every 6 (six) hours. (Patient not taking: Reported on 07/06/2019) 30 tablet 0    Prenat-FeCbn-FeAspGl-FA-Omega (OB COMPLETE/DHA) 30-10-1-200 MG CAPS Take 1 capsule by mouth daily. (Patient not taking: Reported on 07/20/2019) 90 capsule 1     Review of Systems  Constitutional:  Negative for chills and fever.  Gastrointestinal:  Positive for abdominal pain and nausea. Negative for vomiting.  Genitourinary:  Positive for dysuria, hematuria and urgency. Negative for flank pain.  Musculoskeletal:  Negative for back pain.   Physical Exam   Blood pressure 115/83, pulse 83, temperature 98.4 F (36.9 C), temperature source Oral,  resp. rate 18, height 5' (1.524 m), weight 63 kg, last menstrual period 04/22/2022, SpO2 100 %, unknown if currently breastfeeding.  Physical Exam Vitals and nursing note reviewed.  Constitutional:      General: She is not in acute distress.    Appearance: Normal appearance.  HENT:     Head: Normocephalic and atraumatic.  Cardiovascular:     Rate and Rhythm: Normal rate.  Pulmonary:     Effort: Pulmonary effort is normal. No respiratory distress.  Abdominal:     General: There is no distension.     Palpations: Abdomen is soft. There is no mass.     Tenderness: There is no abdominal tenderness. There is no right CVA tenderness, left CVA tenderness, guarding or rebound.     Hernia: No hernia is present.  Musculoskeletal:        General: Normal range of motion.     Cervical back: Normal range of motion.  Skin:    General: Skin is warm and dry.   Neurological:     General: No focal deficit present.     Mental Status: She is alert and oriented to person, place, and time.  Psychiatric:        Mood and Affect: Mood normal.        Behavior: Behavior normal.   FHT 166  Results for orders placed or performed during the hospital encounter of 07/07/22 (from the past 24 hour(s))  Pregnancy, urine POC     Status: Abnormal   Collection Time: 07/07/22  4:28 PM  Result Value Ref Range   Preg Test, Ur POSITIVE (A) NEGATIVE  Urinalysis, Routine w reflex microscopic Urine, Clean Catch     Status: Abnormal   Collection Time: 07/07/22  4:30 PM  Result Value Ref Range   Color, Urine STRAW (A) YELLOW   APPearance HAZY (A) CLEAR   Specific Gravity, Urine 1.001 (L) 1.005 - 1.030   pH 6.0 5.0 - 8.0   Glucose, UA NEGATIVE NEGATIVE mg/dL   Hgb urine dipstick MODERATE (A) NEGATIVE   Bilirubin Urine NEGATIVE NEGATIVE   Ketones, ur NEGATIVE NEGATIVE mg/dL   Protein, ur NEGATIVE NEGATIVE mg/dL   Nitrite NEGATIVE NEGATIVE   Leukocytes,Ua LARGE (A) NEGATIVE   RBC / HPF 0-5 0 - 5 RBC/hpf   WBC, UA 11-20 0 - 5 WBC/hpf   Bacteria, UA RARE (A) NONE SEEN   Squamous Epithelial / HPF 0-5 0 - 5 /HPF    MAU Course  Procedures  MDM Labs reviewed. Suspect UTI, will treat and send culture. No signs of pyelo. Stable for discharge.   Assessment and Plan   1. [redacted] weeks gestation of pregnancy   2. Urinary tract infection in mother during first trimester of pregnancy    Discharge home Follow up with OB provider of choice- list provided Rx Duricef Rx Pyridium Return precautions  Allergies as of 07/07/2022   No Known Allergies      Medication List     STOP taking these medications    Accu-Chek FastClix Lancets Misc   Accu-Chek Guide w/Device Kit   amLODipine 10 MG tablet Commonly known as: Norvasc   Blood Pressure Kit [place]   glucose blood test strip   ibuprofen 600 MG tablet Commonly known as:  ADVIL   Iron 325 (65 Fe) MG Tabs       TAKE these medications    cefadroxil 500 MG capsule Commonly known as: DURICEF Take 1  capsule (500 mg total) by mouth 2 (two) times daily.   OB Complete/DHA 30-10-1-200 MG Caps Take 1 capsule by mouth daily.   phenazopyridine 200 MG tablet Commonly known as: PYRIDIUM Take 1 tablet (200 mg total) by mouth 3 (three) times daily.         Julianne Handler, CNM 07/07/2022, 5:53 PM

## 2022-07-07 NOTE — Discharge Instructions (Signed)
Center for [place] for [place] locations:  Hours may vary. Please call for an appointment  Center for [place] at [facility]             [address] [place], [place] [address] [place] for [place] at [place], [place], [place], [place], [address] [phone]  Center for [facility] at [facility] [place], [place], [place], [place], [address] [phone]  Center for [place] at [place], [place], [place], [place], [address] [phone]  Center for [place] at [facility]                                 [place], [place], [place], [address] [phone]  Center for [place] at [facility]                                    [address] [place], [place], [address] [place] for [place] at [place], [address], [place], [place], [address]                                                   Safe Medications in Pregnancy    Acne: Benzoyl Peroxide Salicylic Acid  Backache/Headache: Tylenol: 2 regular strength every 4 hours OR              2 Extra strength every 6 hours  Colds/Coughs/Allergies: Benadryl (alcohol free) 25 mg every 6 hours as needed Breath right strips Claritin Cepacol throat lozenges Chloraseptic throat spray Cold-Eeze- up to three times per day Cough drops, alcohol free Flonase (by prescription only) Guaifenesin Mucinex Robitussin DM (plain only, alcohol free) Saline nasal spray/drops Sudafed (pseudoephedrine) & Actifed ** use only after [redacted] weeks gestation and if you do not have high blood pressure Tylenol Vicks Vaporub Zinc lozenges Zyrtec   Constipation: Colace Ducolax suppositories Fleet enema Glycerin  suppositories Metamucil Milk of magnesia Miralax Senokot Smooth move tea  Diarrhea: Kaopectate Imodium A-D  *NO pepto Bismol  Hemorrhoids: Anusol Anusol HC Preparation H Tucks  Indigestion: Tums Maalox Mylanta Zantac  Pepcid  Insomnia: Benadryl (alcohol free) '25mg'$  every 6 hours as needed Tylenol PM Unisom, no Gelcaps  Leg Cramps: Tums MagGel  Nausea/Vomiting:  Bonine Dramamine Emetrol Ginger extract Sea bands Meclizine  Nausea medication to take during pregnancy:  Unisom (doxylamine succinate 25 mg tablets) Take one tablet daily at bedtime. If symptoms are not adequately controlled, the dose can be increased to a maximum recommended dose of two  tablets daily (1/2 tablet in the morning, 1/2 tablet mid-afternoon and one at bedtime). Vitamin B6 '100mg'$  tablets. Take one tablet twice a day (up to 200 mg per day).  Skin Rashes: Aveeno products Benadryl cream or '25mg'$  every 6 hours as needed Calamine Lotion 1% cortisone cream  Yeast infection: Gyne-lotrimin 7 Monistat 7   **If taking multiple medications, please check labels to avoid duplicating the same active ingredients **take medication as directed on the label ** Do not exceed 4000 mg of tylenol in 24 hours **Do not take medications that contain aspirin or ibuprofen

## 2022-07-09 LAB — CULTURE, OB URINE: Culture: 20000 — AB

## 2022-07-20 ENCOUNTER — Ambulatory Visit (INDEPENDENT_AMBULATORY_CARE_PROVIDER_SITE_OTHER): Payer: No Typology Code available for payment source

## 2022-07-20 DIAGNOSIS — O0990 Supervision of high risk pregnancy, unspecified, unspecified trimester: Secondary | ICD-10-CM | POA: Insufficient documentation

## 2022-07-20 DIAGNOSIS — Z3481 Encounter for supervision of other normal pregnancy, first trimester: Secondary | ICD-10-CM | POA: Diagnosis not present

## 2022-07-20 DIAGNOSIS — Z3A12 12 weeks gestation of pregnancy: Secondary | ICD-10-CM

## 2022-07-20 DIAGNOSIS — Z3480 Encounter for supervision of other normal pregnancy, unspecified trimester: Secondary | ICD-10-CM

## 2022-07-20 NOTE — Progress Notes (Signed)
New OB Intake  I connected with Sierra Gamble  on 07/20/22 at  3:10 PM EST by telephone Video Visit and verified that I am speaking with the correct person using two identifiers. Nurse is located at [facility] and pt is located at Work.  I discussed the limitations, risks, security and privacy concerns of performing an evaluation and management service by telephone and the availability of in person appointments. I also discussed with the patient that there may be a patient responsible charge related to this service. The patient expressed understanding and agreed to proceed.  I explained I am completing New OB Intake today. We discussed EDD of 01/27/23 that is based on LMP of 04/22/2022. Pt is G5/P2113. I reviewed her allergies, medications, Medical/Surgical/OB history, and appropriate screenings. I informed her of [facility] services. [facility] information placed in AVS. Based on history, this is a low risk pregnancy.  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 Patient given information for [facility] Healthy Baby website for more information about Women's and [place]. Patient is not interested in water birth. Offered upcoming OB visit with  CNM to discuss further.  MyChart/Babyscripts MyChart access verified. I explained pt will have some visits in office and some virtually. Babyscripts instructions given and order placed. Patient verifies receipt of registration text/e-mail. Account successfully created and app downloaded.  Blood Pressure Cuff/Weight Scale Patient has access to a BP cuff at home. Explained after first prenatal appt pt will check weekly and document in 62. Patient does not have weight scale; patient may purchase if they desire to track weight weekly in Babyscripts.  Anatomy US Explained first scheduled [place] will be around 19 weeks. Anatomy US ordered. Date TBD.   Labs Discussed Johnsie Cancel genetic screening with patient. Would like both Panorama and Horizon drawn at new OB visit. Routine prenatal labs needed.  COVID Vaccine Patient has not had COVID vaccine.   Is patient a CenteringPregnancy candidate?  Declined Declined due to Group setting Not a candidate due to  If accepted,   Social Determinants of Health Food Insecurity: Patient denies food insecurity. WIC Referral: Patient is interested in referral to [facility].  Transportation: Patient denies transportation needs. Childcare: Discussed no children allowed at ultrasound appointments. Offered childcare services; patient expresses need for childcare services.  First visit review I reviewed new OB appt with patient. I explained they will have a provider visit that includes prenatal labs, pap smear, genetic screening, and discuss plan of care for pregnancy. Explained pt will be seen by Baltazar Najjar at first visit; encounter routed to appropriate provider. Explained that patient will be seen by pregnancy navigator following visit with provider.   Lucianne Lei, RN 07/20/2022  3:01 PM

## 2022-07-21 ENCOUNTER — Other Ambulatory Visit: Payer: Self-pay

## 2022-07-21 DIAGNOSIS — Z3480 Encounter for supervision of other normal pregnancy, unspecified trimester: Secondary | ICD-10-CM

## 2022-08-20 ENCOUNTER — Encounter: Payer: No Typology Code available for payment source | Admitting: Obstetrics

## 2022-08-21 ENCOUNTER — Other Ambulatory Visit (HOSPITAL_COMMUNITY)
Admission: RE | Admit: 2022-08-21 | Discharge: 2022-08-21 | Disposition: A | Discharge status: Home / Self Care | Payer: No Typology Code available for payment source | Source: Ambulatory Visit | Attending: Obstetrics | Admitting: Obstetrics

## 2022-08-21 ENCOUNTER — Ambulatory Visit (INDEPENDENT_AMBULATORY_CARE_PROVIDER_SITE_OTHER): Payer: No Typology Code available for payment source | Admitting: Obstetrics

## 2022-08-21 ENCOUNTER — Encounter: Payer: Self-pay | Admitting: Obstetrics

## 2022-08-21 VITALS — BP 129/88 | HR 79 | Wt 143.0 lb

## 2022-08-21 DIAGNOSIS — Z3480 Encounter for supervision of other normal pregnancy, unspecified trimester: Secondary | ICD-10-CM | POA: Diagnosis present

## 2022-08-21 DIAGNOSIS — Z3A17 17 weeks gestation of pregnancy: Secondary | ICD-10-CM | POA: Diagnosis not present

## 2022-08-21 DIAGNOSIS — Z3482 Encounter for supervision of other normal pregnancy, second trimester: Secondary | ICD-10-CM

## 2022-08-21 NOTE — Progress Notes (Signed)
Subjective:    Sierra Gamble is being seen today for her first obstetrical visit.  This is not a planned pregnancy. She is at 27w2dgestation. Her obstetrical history is significant for and Preterm Delivery. Relationship with FOB: significant other, not living together. Patient does intend to breast feed. Pregnancy history fully reviewed.  The information documented in the HPI was reviewed and verified.  Menstrual History: OB History     Gravida  5   Para  3   Term  2   Preterm  1   AB  1   Living  3      SAB  1   IAB      Ectopic      Multiple  0   Live Births  3            Patient's last menstrual period was 04/22/2022.    Past Medical History:  Diagnosis Date   Anemia    Gestational diabetes     Past Surgical History:  Procedure Laterality Date   NO PAST SURGERIES      (Not in a hospital admission)  No Known Allergies  Social History   Tobacco Use   Smoking status: Never   Smokeless tobacco: Never  Substance Use Topics   Alcohol use: No    Family History  Problem Relation Age of Onset   Thyroid disease Mother    Stroke Mother    Hyperlipidemia Mother    COPD Father    Diabetes Maternal Grandmother    Thyroid disease Maternal Grandmother      Review of Systems Constitutional: negative for weight loss Gastrointestinal: negative for vomiting Genitourinary:negative for genital lesions and vaginal discharge and dysuria Musculoskeletal:negative for back pain Behavioral/Psych: negative for abusive relationship, depression, illegal drug usage and tobacco use    Objective:    BP 129/88   Pulse 79   Wt 143 lb (64.9 kg)   LMP 04/22/2022   BMI 27.93 kg/m  General Appearance:    Alert, cooperative, no distress, appears stated age  Head:    Normocephalic, without obvious abnormality, atraumatic  Eyes:    PERRL, conjunctiva/corneas clear, EOM's intact, fundi    benign, both eyes  Ears:    Normal TM's and external ear canals, both ears  Nose:    Nares normal, septum midline, mucosa normal, no drainage    or sinus tenderness  Throat:   Lips, mucosa, and tongue normal; teeth and gums normal  Neck:   Supple, symmetrical, trachea midline, no adenopathy;    thyroid:  no enlargement/tenderness/nodules; no carotid   bruit or JVD  Back:     Symmetric, no curvature, ROM normal, no CVA tenderness  Lungs:     Clear to auscultation bilaterally, respirations unlabored  Chest Wall:    No tenderness or deformity   Heart:    Regular rate and rhythm, S1 and S2 normal, no murmur, rub   or gallop  Breast Exam:    No tenderness, masses, or nipple abnormality  Abdomen:     Soft, non-tender, bowel sounds active all four quadrants,    no masses, no organomegaly  Genitalia:    Normal female without lesion, discharge or tenderness  Extremities:   Extremities normal, atraumatic, no cyanosis or edema  Pulses:   2+ and symmetric all extremities  Skin:   Skin color, texture, turgor normal, no rashes or lesions  Lymph nodes:   Cervical, supraclavicular, and axillary nodes normal  Neurologic:   CNII-XII intact, normal  strength, sensation and reflexes    throughout      Lab Review Urine pregnancy test Labs reviewed yes Radiologic studies reviewed no  Assessment:    Pregnancy at 1w2dweeks    Plan:      Prenatal vitamins.  Counseling provided regarding continued use of seat belts, cessation of alcohol consumption, smoking or use of illicit drugs; infection precautions i.e., influenza/TDAP immunizations, toxoplasmosis,CMV, parvovirus, listeria and varicella; workplace safety, exercise during pregnancy; routine dental care, safe medications, sexual activity, hot tubs, saunas, pools, travel, caffeine use, fish and methlymercury, potential toxins, hair treatments, varicose veins Weight gain recommendations per IOM guidelines reviewed: underweight/BMI< 18.5--> gain 28 - 40 lbs; normal weight/BMI 18.5 - 24.9--> gain 25 - 35 lbs; overweight/BMI 25 - 29.9-->  gain 15 - 25 lbs; obese/BMI >30->gain  11 - 20 lbs Problem list reviewed and updated. FIRST/CF mutation testing/NIPT/QUAD SCREEN/fragile X/Ashkenazi Jewish population testing/Spinal muscular atrophy discussed: requested. Role of ultrasound in pregnancy discussed; fetal survey: requested. Amniocentesis discussed: not indicated.  No orders of the defined types were placed in this encounter.  Orders Placed This Encounter  Procedures   [facility] PRENATAL TEST FULL PANEL    ==========Department Information========== ID: [id number]:[facility]Dept: [phone] Fax: [phone]    Order Specific Question:   Expected due date (MM/DD/YYYY):    Answer:   01/27/2023    Order Specific Question:   Is this a twin pregnancy? (viable, no vanished twin)    Answer:   No    Order Specific Question:   Is this a surrogate or egg donor pregnancy?    Answer:   No    Order Specific Question:   I want fetal sex included in the report:    Answer:   Yes    Order Specific Question:   Maternal Weight (lbs):    Answer:   167   Order Specific Question:   Which Microdeletion Panel should be ordered?    Answer:   22q11.2 Deletion    Order Specific Question:   What type of billing?    Answer:   BProgramme researcher, broadcasting/film/videoQuestion:   By placing this electronic order I confirm the testing ordered herein is medically necessary and this patient has been informed of the details of the genetic test(s) ordered, including the risks, benefits, and alternatives, and has consented to testing.    Answer:   Yes    Order Specific Question:   Select an order diagnosis: For additional options refer to [email or web address]   Answer:   Supervision of elderly primigravida, second trimester [[id number]]   HORIZON CUSTOM    ==========Department  Information========== ID: [id number]:[facility]Dept: [phone] Fax: [phone]    Order Specific Question:   Specify the name or ID of a valid Horizon Custom Panel:    Answer:   HBASIC    Order Specific Question:   Is patient pregnant?    Answer:   Yes    Order Specific Question:   Practice ensures that HIPAA consent is obtained and will make available to [facility]?  Answer:   Yes    Order Specific Question:   By placing this electronic order I confirm the testing ordered herein is medically necessary and this patient has been informed of the details of the genetic test(s) ordered, including the risks, benefits, and alternatives, and has consented to testing.    Answer:   Yes    Order Specific Question:   What type of billing?    Answer:   Self-Pay    Order Specific Question:   Select an order diagnosis: For additional options refer to [email or web address]    Answer:   Encounter for supervision of other normal pregnancy in second trimester [id number]    Order Specific Question:   Tay-Sachs add-on test?    Answer:   No   CBC/D/Plt+RPR+Rh+ABO+RubIgG...   HgB A1c   AFP, Serum, Open Spina Bifida    Order Specific Question:   Is patient insulin dependent?    Answer:   No    Order Specific Question:   Gestational Age (GA), weeks    Answer:   17.2    Order Specific Question:   Date on which patient was at this [place]    Answer:   08/21/2022    Order Specific Question:   GA Calculation Method    Answer:   LMP    Order Specific Question:   Number of fetuses    Answer:   1    Follow up in 4 weeks.  I have spent a total of 20 minutes of face-to-face time, excluding clinical staff time, reviewing notes and preparing to see patient, ordering tests and/or medications, and counseling the patient.     Shelly Bombard, MD 08/21/2022 9:22 AM

## 2022-08-21 NOTE — Progress Notes (Signed)
Pt presents for new OB visit. No concerns at this time.

## 2022-08-23 LAB — AFP, SERUM, OPEN SPINA BIFIDA
AFP MoM: 1.11
AFP Value: 45.5 ng/mL
Gest. Age on Collection Date: 17.2 weeks
Maternal Age At EDD: 37.6 yr
OSBR Risk 1 IN: 8647
Test Results:: NEGATIVE
Weight: 143 [lb_av]

## 2022-08-23 LAB — CBC/D/PLT+RPR+RH+ABO+RUBIGG...
Antibody Screen: NEGATIVE
Basophils Absolute: 0 10*3/uL (ref 0.0–0.2)
Basos: 0 %
EOS (ABSOLUTE): 0.3 10*3/uL (ref 0.0–0.4)
Eos: 3 %
HCV Ab: NONREACTIVE
HIV Screen 4th Generation wRfx: NONREACTIVE
Hematocrit: 29.1 % — ABNORMAL LOW (ref 34.0–46.6)
Hemoglobin: 9.1 g/dL — ABNORMAL LOW (ref 11.1–15.9)
Hepatitis B Surface Ag: NEGATIVE
Immature Grans (Abs): 0 10*3/uL (ref 0.0–0.1)
Immature Granulocytes: 0 %
Lymphocytes Absolute: 1.8 10*3/uL (ref 0.7–3.1)
Lymphs: 19 %
MCH: 24 pg — ABNORMAL LOW (ref 26.6–33.0)
MCHC: 31.3 g/dL — ABNORMAL LOW (ref 31.5–35.7)
MCV: 77 fL — ABNORMAL LOW (ref 79–97)
Monocytes Absolute: 0.8 10*3/uL (ref 0.1–0.9)
Monocytes: 9 %
Neutrophils Absolute: 6.4 10*3/uL (ref 1.4–7.0)
Neutrophils: 69 %
Platelets: 357 10*3/uL (ref 150–450)
RBC: 3.79 x10E6/uL (ref 3.77–5.28)
RDW: 14.1 % (ref 11.7–15.4)
RPR Ser Ql: NONREACTIVE
Rh Factor: POSITIVE
Rubella Antibodies, IGG: 2.01 index (ref >0.99)
WBC: 9.3 10*3/uL (ref 3.4–10.8)

## 2022-08-23 LAB — HEMOGLOBIN A1C
Est. average glucose Bld gHb Est-mCnc: 111 mg/dL
Hgb A1c MFr Bld: 5.5 % (ref 4.8–5.6)

## 2022-08-23 LAB — HCV INTERPRETATION

## 2022-08-24 LAB — CERVICOVAGINAL ANCILLARY ONLY
Chlamydia: NEGATIVE
Comment: NEGATIVE
Comment: NEGATIVE
Comment: NORMAL
Neisseria Gonorrhea: NEGATIVE
Trichomonas: NEGATIVE

## 2022-08-24 LAB — CYTOLOGY - PAP
Comment: NEGATIVE
Diagnosis: NEGATIVE
High risk HPV: NEGATIVE

## 2022-08-25 ENCOUNTER — Other Ambulatory Visit: Payer: Self-pay | Admitting: Obstetrics

## 2022-08-25 DIAGNOSIS — O99012 Anemia complicating pregnancy, second trimester: Secondary | ICD-10-CM

## 2022-08-25 MED ORDER — ACCRUFER 30 MG PO CAPS: 1.0000 | ORAL_CAPSULE | Freq: Two times a day (BID) | ORAL | 3 refills | Status: DC

## 2022-08-26 ENCOUNTER — Telehealth: Payer: Self-pay

## 2022-08-26 NOTE — Telephone Encounter (Signed)
S/w pt and about iron through [employer]. Pt stated that she already received the text.

## 2022-08-27 LAB — PANORAMA PRENATAL TEST FULL PANEL:PANORAMA TEST PLUS 5 ADDITIONAL MICRODELETIONS: FETAL FRACTION: 7.3

## 2022-08-30 LAB — HORIZON CUSTOM: REPORT SUMMARY: POSITIVE — AB

## 2022-09-01 ENCOUNTER — Other Ambulatory Visit: Payer: Self-pay

## 2022-09-01 DIAGNOSIS — D565 Hemoglobin E-beta thalassemia: Secondary | ICD-10-CM

## 2022-09-02 ENCOUNTER — Encounter: Payer: Self-pay | Admitting: *Deleted

## 2022-09-02 ENCOUNTER — Ambulatory Visit: Payer: No Typology Code available for payment source | Admitting: *Deleted

## 2022-09-02 ENCOUNTER — Other Ambulatory Visit: Payer: Self-pay | Admitting: *Deleted

## 2022-09-02 ENCOUNTER — Encounter: Payer: Self-pay | Admitting: Genetics

## 2022-09-02 ENCOUNTER — Ambulatory Visit: Payer: No Typology Code available for payment source

## 2022-09-02 ENCOUNTER — Ambulatory Visit: Payer: No Typology Code available for payment source | Attending: Obstetrics & Gynecology

## 2022-09-02 VITALS — BP 109/76 | HR 75

## 2022-09-02 DIAGNOSIS — O09292 Supervision of pregnancy with other poor reproductive or obstetric history, second trimester: Secondary | ICD-10-CM | POA: Diagnosis not present

## 2022-09-02 DIAGNOSIS — O09899 Supervision of other high risk pregnancies, unspecified trimester: Secondary | ICD-10-CM

## 2022-09-02 DIAGNOSIS — Z362 Encounter for other antenatal screening follow-up: Secondary | ICD-10-CM

## 2022-09-02 DIAGNOSIS — O09522 Supervision of elderly multigravida, second trimester: Secondary | ICD-10-CM

## 2022-09-02 DIAGNOSIS — Z3480 Encounter for supervision of other normal pregnancy, unspecified trimester: Secondary | ICD-10-CM | POA: Diagnosis present

## 2022-09-02 DIAGNOSIS — Z3A19 19 weeks gestation of pregnancy: Secondary | ICD-10-CM | POA: Insufficient documentation

## 2022-09-02 NOTE — Progress Notes (Signed)
Sierra Gamble was scheduled for a virtual genetic counseling appointment today at [facility] [place] opened the MyChart message containing the link to her genetic counseling appointment today at 11:45am. [name] did not keep her genetic counseling appointment. If [name] would like genetic counseling in the future she should contact the [facility] at [phone].

## 2022-09-18 ENCOUNTER — Ambulatory Visit (INDEPENDENT_AMBULATORY_CARE_PROVIDER_SITE_OTHER): Payer: No Typology Code available for payment source

## 2022-09-18 VITALS — BP 109/72 | HR 94 | Wt 142.4 lb

## 2022-09-18 DIAGNOSIS — Z3482 Encounter for supervision of other normal pregnancy, second trimester: Secondary | ICD-10-CM

## 2022-09-18 DIAGNOSIS — Z3A21 21 weeks gestation of pregnancy: Secondary | ICD-10-CM

## 2022-09-18 DIAGNOSIS — Z3480 Encounter for supervision of other normal pregnancy, unspecified trimester: Secondary | ICD-10-CM

## 2022-09-18 DIAGNOSIS — Z8632 Personal history of gestational diabetes: Secondary | ICD-10-CM

## 2022-09-18 NOTE — Patient Instructions (Signed)

## 2022-09-18 NOTE — Progress Notes (Signed)
Pt presents for ROB visit. Pt is going to [place] in May, wants to know if it is safe to travel. No other concerns at this time.

## 2022-09-18 NOTE — Progress Notes (Signed)
   HIGH-RISK PREGNANCY OFFICE VISIT  Patient name: Sierra Gamble MRN [id number]  Date of birth: 01-Jul-1985 Chief Complaint:   Routine Prenatal Visit  Subjective:   Sierra Gamble is a 37 y.o. 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  Patient presents today, alone, with no complaints.  However, she does question appropriateness of travel to [place] in May. She also reports feeling like she is coming down with something.  Patient is unsure of if she is experiencing fetal movement. Patient denies abdominal cramping or contractions.  Patient denies vaginal concerns including abnormal discharge, leaking of fluid, and bleeding. No issues with urination, constipation, or diarrhea.    Contractions: Not present. Vag. Bleeding: None.  Movement: Absent.  Reviewed past medical,surgical, social, obstetrical and family history as well as problem list, medications and allergies.  Objective   Vitals:   09/18/22 0854  BP: 109/72  Pulse: 94  Weight: 142 lb 6.4 oz (64.6 kg)  Body mass index is 27.81 kg/m.  Total Weight Gain:13 lb 6.4 oz (6.078 kg)         Physical Examination:   General appearance: Well appearing, and in no distress  Mental status: Alert, oriented to person, place, and time  Skin: Warm &  dry  Cardiovascular: Normal heart rate noted  Respiratory: Normal respiratory effort, no distress  Abdomen: Soft, gravid, nontender, AGA with fundus at +2/U  Pelvic: Cervical exam deferred           Extremities: Edema: None  Fetal Status: Fetal Heart Rate (bpm): 151  Movement: Absent   No results found for this or any previous visit (from the past 24 hour(s)).  Assessment & Plan:  High-risk pregnancy of a 37 y.o., [id number] at [redacted]w[redacted]d with an Estimated Date of Delivery: 01/27/23   1. Supervision of other normal pregnancy, antepartum -Anticipatory guidance for upcoming appts. -Patient to schedule next appt in 4 weeks for an in-person visit.   2. [redacted] weeks gestation of pregnancy -Doing well. -Discussed travel concerns. Encouraged ambulation every 1-2 hours and usage of compression stockings. Further advised elevation of feet during layovers.  -Encouraged usage of OTC medications to treat symptoms of ill as they occur.  -Encouraged increased hydration, facial washing, and moisturizing for decreasing acne.   3. History of gestational diabetes mellitus (GDM) -A1 in previous pregnancy 2021. -Plan for GTT b/t 26-28 weeks.      Meds: No orders of the defined types were placed in this encounter.  Labs/procedures today:  Lab Orders  No laboratory test(s) ordered today     Reviewed: Preterm labor symptoms and general obstetric precautions including but not limited to vaginal bleeding, contractions, leaking of fluid and fetal movement were reviewed in detail with the patient.  All questions were answered.  Follow-up: Return in about 4 weeks (around 10/16/2022) for [place].  No orders of the defined types were placed in this encounter.  Maryann Conners MSN, CNM 09/18/2022

## 2022-10-16 ENCOUNTER — Ambulatory Visit (INDEPENDENT_AMBULATORY_CARE_PROVIDER_SITE_OTHER): Payer: No Typology Code available for payment source | Admitting: Obstetrics & Gynecology

## 2022-10-16 VITALS — BP 103/68 | HR 88 | Wt 147.0 lb

## 2022-10-16 DIAGNOSIS — Z8751 Personal history of pre-term labor: Secondary | ICD-10-CM

## 2022-10-16 DIAGNOSIS — Z148 Genetic carrier of other disease: Secondary | ICD-10-CM

## 2022-10-16 DIAGNOSIS — Z3480 Encounter for supervision of other normal pregnancy, unspecified trimester: Secondary | ICD-10-CM

## 2022-10-16 NOTE — Progress Notes (Signed)
   PRENATAL VISIT NOTE  Subjective:  Sierra Gamble is a 37 y.o. [id number] at [redacted]w[redacted]d being seen today for ongoing prenatal care.  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  Patient reports no complaints.  Contractions: Not present. Vag. Bleeding: None.  Movement: Present. Denies leaking of fluid.   The following portions of the patient's history were reviewed and updated as appropriate: allergies, current medications, past family history, past medical history, past social history, past surgical history and problem list.   Objective:   Vitals:   10/16/22 0854 10/16/22 0858  BP:  103/68  Pulse:  88  Weight: 147 lb 12.8 oz (67 kg) 147 lb (66.7 kg)    Fetal Status: Fetal Heart Rate (bpm): 154   Movement: Present     General:  Alert, oriented and cooperative. Patient is in no acute distress.  Skin: Skin is warm and dry. No rash noted.   Cardiovascular: Normal heart rate noted  Respiratory: Normal respiratory effort, no problems with respiration noted  Abdomen: Soft, gravid, appropriate for gestational age.  Pain/Pressure: Absent     Pelvic: Cervical exam deferred        Extremities: Normal range of motion.  Edema: None  Mental Status: Normal mood and affect. Normal behavior. Normal judgment and thought content.   Assessment and Plan:  Pregnancy: [id number] at [redacted]w[redacted]d There are no diagnoses linked to this encounter. Preterm labor symptoms and general obstetric precautions including but not limited to vaginal bleeding, contractions, leaking of fluid and fetal movement were reviewed in detail with the patient. Please refer to After Visit Summary for other counseling recommendations.   Return in about 4 weeks (around 11/13/2022).  Future Appointments  Date Time Provider  Department Center  11/03/2022  8:00 AM [facility] LAB [facility] None  11/03/2022  8:35 AM Leftwich-Kirby, Wilmer Floor, CNM [facility] None  11/04/2022  9:45 AM [facility] NURSE [facility]  11/04/2022 10:00 AM [facility] US1 [facility]  11/18/2022  8:35 AM Adam Phenix, MD [facility] None    Scheryl Darter, MD

## 2022-10-16 NOTE — Progress Notes (Signed)
ROB 25.2wks No unusual complaints Anemia: taking Accrufer

## 2022-11-03 ENCOUNTER — Ambulatory Visit (INDEPENDENT_AMBULATORY_CARE_PROVIDER_SITE_OTHER): Payer: No Typology Code available for payment source | Admitting: Advanced Practice Midwife

## 2022-11-03 ENCOUNTER — Other Ambulatory Visit: Payer: No Typology Code available for payment source

## 2022-11-03 ENCOUNTER — Encounter: Payer: Self-pay | Admitting: Advanced Practice Midwife

## 2022-11-03 VITALS — BP 109/75 | HR 86 | Wt 145.2 lb

## 2022-11-03 DIAGNOSIS — Z23 Encounter for immunization: Secondary | ICD-10-CM

## 2022-11-03 DIAGNOSIS — O0990 Supervision of high risk pregnancy, unspecified, unspecified trimester: Secondary | ICD-10-CM

## 2022-11-03 DIAGNOSIS — Z3A27 27 weeks gestation of pregnancy: Secondary | ICD-10-CM

## 2022-11-03 DIAGNOSIS — Z8751 Personal history of pre-term labor: Secondary | ICD-10-CM

## 2022-11-03 DIAGNOSIS — O0993 Supervision of high risk pregnancy, unspecified, third trimester: Secondary | ICD-10-CM

## 2022-11-03 DIAGNOSIS — Z8759 Personal history of other complications of pregnancy, childbirth and the puerperium: Secondary | ICD-10-CM

## 2022-11-03 NOTE — Progress Notes (Signed)
   PRENATAL VISIT NOTE  Subjective:  Sierra Gamble is a 37 y.o. [id number] at [redacted]w[redacted]d being seen today for ongoing prenatal care.  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  Patient reports no complaints.  Contractions: Not present. Vag. Bleeding: None.  Movement: Present. Denies leaking of fluid.   The following portions of the patient's history were reviewed and updated as appropriate: allergies, current medications, past family history, past medical history, past social history, past surgical history and problem list.   Objective:   Vitals:   11/03/22 0817  BP: 109/75  Pulse: 86  Weight: 145 lb 3.2 oz (65.9 kg)    Fetal Status: Fetal Heart Rate (bpm): 175 Fundal Height: 27 cm Movement: Present     General:  Alert, oriented and cooperative. Patient is in no acute distress.  Skin: Skin is warm and dry. No rash noted.   Cardiovascular: Normal heart rate noted  Respiratory: Normal respiratory effort, no problems with respiration noted  Abdomen: Soft, gravid, appropriate for gestational age.  Pain/Pressure: Absent     Pelvic: Cervical exam deferred        Extremities: Normal range of motion.  Edema: Trace  Mental Status: Normal mood and affect. Normal behavior. Normal judgment and thought content.   Assessment and Plan:  Pregnancy: [id number] at [redacted]w[redacted]d 1. Supervision of high risk pregnancy, antepartum --Anticipatory guidance about next visits/weeks of pregnancy given.   - HIV antibody (with reflex) - RPR - CBC - Glucose Tolerance, 2 Hours w/1 Hour - Tdap vaccine greater than or equal to 7yo IM  2. [redacted] weeks gestation of pregnancy   3. History of preterm premature rupture of membranes (PPROM) G1 02/2005: [redacted]w[redacted]d weeks NSVD G2 SAB G3 12/2017: [redacted]w[redacted]d PPROM and PTD due to labor.  G4  05/2019 [redacted]w[redacted]d NSVD  Preterm labor symptoms and general obstetric precautions including but not limited to vaginal bleeding, contractions, leaking of fluid and fetal movement were reviewed in detail with the patient. Please refer to After Visit Summary for other counseling recommendations.   Return in about 2 weeks (around 11/17/2022) for As scheduled.  Future Appointments  Date Time Provider Department Center  11/04/2022  9:45 AM [facility] NURSE [facility]  11/04/2022 10:00 AM [facility] US1 [facility]  11/18/2022  8:35 AM Adam Phenix, MD [facility] None  12/03/2022  3:30 PM Warden Fillers, MD [facility] None  12/16/2022 10:35 AM Ndulue, Nadene Rubins, MD [facility] None    Sharen Counter, CNM

## 2022-11-03 NOTE — Progress Notes (Signed)
Pt presents for ROB visit. Tdap given today. No concerns

## 2022-11-04 ENCOUNTER — Ambulatory Visit: Payer: No Typology Code available for payment source | Attending: Maternal & Fetal Medicine

## 2022-11-04 ENCOUNTER — Ambulatory Visit: Payer: No Typology Code available for payment source | Admitting: *Deleted

## 2022-11-04 ENCOUNTER — Other Ambulatory Visit: Payer: Self-pay | Admitting: *Deleted

## 2022-11-04 VITALS — BP 115/62 | HR 86

## 2022-11-04 DIAGNOSIS — O09522 Supervision of elderly multigravida, second trimester: Secondary | ICD-10-CM | POA: Diagnosis not present

## 2022-11-04 DIAGNOSIS — O09899 Supervision of other high risk pregnancies, unspecified trimester: Secondary | ICD-10-CM | POA: Insufficient documentation

## 2022-11-04 DIAGNOSIS — O09523 Supervision of elderly multigravida, third trimester: Secondary | ICD-10-CM | POA: Diagnosis not present

## 2022-11-04 DIAGNOSIS — Z362 Encounter for other antenatal screening follow-up: Secondary | ICD-10-CM | POA: Insufficient documentation

## 2022-11-04 DIAGNOSIS — Z3A28 28 weeks gestation of pregnancy: Secondary | ICD-10-CM | POA: Diagnosis not present

## 2022-11-04 DIAGNOSIS — O24419 Gestational diabetes mellitus in pregnancy, unspecified control: Secondary | ICD-10-CM | POA: Insufficient documentation

## 2022-11-04 DIAGNOSIS — O0993 Supervision of high risk pregnancy, unspecified, third trimester: Secondary | ICD-10-CM | POA: Diagnosis present

## 2022-11-04 DIAGNOSIS — O09213 Supervision of pregnancy with history of pre-term labor, third trimester: Secondary | ICD-10-CM

## 2022-11-04 DIAGNOSIS — O24410 Gestational diabetes mellitus in pregnancy, diet controlled: Secondary | ICD-10-CM | POA: Diagnosis not present

## 2022-11-04 DIAGNOSIS — Z3689 Encounter for other specified antenatal screening: Secondary | ICD-10-CM

## 2022-11-04 LAB — CBC
Hematocrit: 29.6 % — ABNORMAL LOW (ref 34.0–46.6)
Hemoglobin: 9.3 g/dL — ABNORMAL LOW (ref 11.1–15.9)
MCH: 24.5 pg — ABNORMAL LOW (ref 26.6–33.0)
MCHC: 31.4 g/dL — ABNORMAL LOW (ref 31.5–35.7)
MCV: 78 fL — ABNORMAL LOW (ref 79–97)
Platelets: 337 10*3/uL (ref 150–450)
RBC: 3.79 x10E6/uL (ref 3.77–5.28)
RDW: 14.7 % (ref 11.7–15.4)
WBC: 9.6 10*3/uL (ref 3.4–10.8)

## 2022-11-04 LAB — RPR: RPR Ser Ql: NONREACTIVE

## 2022-11-04 LAB — GLUCOSE TOLERANCE, 2 HOURS W/ 1HR
Glucose, 1 hour: 209 mg/dL — ABNORMAL HIGH (ref 70–179)
Glucose, 2 hour: 200 mg/dL — ABNORMAL HIGH (ref 70–152)
Glucose, Fasting: 73 mg/dL (ref 70–91)

## 2022-11-04 LAB — HIV ANTIBODY (ROUTINE TESTING W REFLEX): HIV Screen 4th Generation wRfx: NONREACTIVE

## 2022-11-06 ENCOUNTER — Other Ambulatory Visit: Payer: Self-pay | Admitting: *Deleted

## 2022-11-06 ENCOUNTER — Encounter (HOSPITAL_BASED_OUTPATIENT_CLINIC_OR_DEPARTMENT_OTHER): Payer: Self-pay | Admitting: Advanced Practice Midwife

## 2022-11-06 DIAGNOSIS — O24419 Gestational diabetes mellitus in pregnancy, unspecified control: Secondary | ICD-10-CM

## 2022-11-06 MED ORDER — ACCU-CHEK GUIDE W/DEVICE KIT: 1.0000 | PACK | Freq: Four times a day (QID) | 0 refills | Status: DC

## 2022-11-06 MED ORDER — ACCU-CHEK GUIDE VI STRP: ORAL_STRIP | 12 refills | Status: DC

## 2022-11-06 MED ORDER — ACCU-CHEK SOFTCLIX LANCETS MISC: 1.0000 | Freq: Four times a day (QID) | 12 refills | Status: DC

## 2022-11-06 NOTE — Progress Notes (Signed)
Meter and supplies ordered. MC message to pt indicating where supplies ordered and advised to pick up and bring to GDM education appt.

## 2022-11-18 ENCOUNTER — Ambulatory Visit (INDEPENDENT_AMBULATORY_CARE_PROVIDER_SITE_OTHER): Payer: No Typology Code available for payment source | Admitting: Obstetrics & Gynecology

## 2022-11-18 VITALS — BP 104/71 | HR 88 | Wt 152.0 lb

## 2022-11-18 DIAGNOSIS — Z8751 Personal history of pre-term labor: Secondary | ICD-10-CM

## 2022-11-18 DIAGNOSIS — O0993 Supervision of high risk pregnancy, unspecified, third trimester: Secondary | ICD-10-CM

## 2022-11-18 DIAGNOSIS — O24419 Gestational diabetes mellitus in pregnancy, unspecified control: Secondary | ICD-10-CM

## 2022-11-18 NOTE — Progress Notes (Signed)
   PRENATAL VISIT NOTE  Subjective:  Sierra Gamble is a 37 y.o. [id number] at [redacted]w[redacted]d being seen today for ongoing prenatal care.  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  Patient reports no complaints.  Contractions: Not present. Vag. Bleeding: None.  Movement: Present. Denies leaking of fluid.   The following portions of the patient's history were reviewed and updated as appropriate: allergies, current medications, past family history, past medical history, past social history, past surgical history and problem list.   Objective:   Vitals:   11/18/22 0833  BP: 104/71  Pulse: 88  Weight: 152 lb (68.9 kg)    Fetal Status: Fetal Heart Rate (bpm): 148   Movement: Present     General:  Alert, oriented and cooperative. Patient is in no acute distress.  Skin: Skin is warm and dry. No rash noted.   Cardiovascular: Normal heart rate noted  Respiratory: Normal respiratory effort, no problems with respiration noted  Abdomen: Soft, gravid, appropriate for gestational age.  Pain/Pressure: Present     Pelvic: Cervical exam deferred        Extremities: Normal range of motion.  Edema: None  Mental Status: Normal mood and affect. Normal behavior. Normal judgment and thought content.   Assessment and Plan:  Pregnancy: [id number] at [redacted]w[redacted]d 1. Supervision of high risk pregnancy in third trimester Failed GTT  2. Gestational diabetes mellitus (GDM) in third trimester, gestational diabetes method of control unspecified Begin BG testing and diet asap  3. History of preterm delivery At 35 weeks  Preterm labor symptoms and general obstetric precautions including but not limited to vaginal bleeding, contractions, leaking of fluid and  fetal movement were reviewed in detail with the patient. Please refer to After Visit Summary for other counseling recommendations.   Return in about 2 weeks (around 12/02/2022).  Future Appointments  Date Time Provider Department Center  12/03/2022  3:30 PM Warden Fillers, MD [facility] None  12/16/2022 10:35 AM Alfredia Ferguson, MD [facility] None  12/16/2022  3:30 PM [facility] NURSE [facility]  12/16/2022  3:45 PM [facility] US4 [facility]    Scheryl Darter, MD

## 2022-11-30 ENCOUNTER — Telehealth: Payer: Self-pay | Admitting: Registered"

## 2022-11-30 NOTE — Telephone Encounter (Signed)
Pt is scheduled for GDM class on 6/12. Called patient to offer appt times before then. Pt states she scheduled out that far because of her work schedule. Pt reports she plans to start checking her blood sugar this week. Pt was advised to let her doctor know if she has questions or issues regarding her blood sugar readings and they may ask me to call her if needed.

## 2022-12-03 ENCOUNTER — Ambulatory Visit: Payer: No Typology Code available for payment source | Admitting: Obstetrics and Gynecology

## 2022-12-03 VITALS — BP 98/64 | HR 84 | Wt 151.0 lb

## 2022-12-03 DIAGNOSIS — Z3A32 32 weeks gestation of pregnancy: Secondary | ICD-10-CM

## 2022-12-03 DIAGNOSIS — Z8751 Personal history of pre-term labor: Secondary | ICD-10-CM

## 2022-12-03 DIAGNOSIS — O09899 Supervision of other high risk pregnancies, unspecified trimester: Secondary | ICD-10-CM

## 2022-12-03 DIAGNOSIS — Z148 Genetic carrier of other disease: Secondary | ICD-10-CM

## 2022-12-03 DIAGNOSIS — Z2839 Other underimmunization status: Secondary | ICD-10-CM

## 2022-12-03 DIAGNOSIS — O09523 Supervision of elderly multigravida, third trimester: Secondary | ICD-10-CM | POA: Insufficient documentation

## 2022-12-03 DIAGNOSIS — O24410 Gestational diabetes mellitus in pregnancy, diet controlled: Secondary | ICD-10-CM

## 2022-12-03 DIAGNOSIS — O0993 Supervision of high risk pregnancy, unspecified, third trimester: Secondary | ICD-10-CM

## 2022-12-03 NOTE — Progress Notes (Signed)
   PRENATAL VISIT NOTE  Subjective:  Sierra Gamble is a 37 y.o. [id number] at [redacted]w[redacted]d being seen today for ongoing prenatal care.  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  Patient doing well with no acute concerns today. She reports no complaints.  Contractions: Not present. Vag. Bleeding: None.  Movement: Present. Denies leaking of fluid.   The following portions of the patient's history were reviewed and updated as appropriate: allergies, current medications, past family history, past medical history, past social history, past surgical history and problem list. Problem list updated.  Objective:   Vitals:   12/03/22 1545  BP: 98/64  Pulse: 84  Weight: 151 lb (68.5 kg)    Fetal Status: Fetal Heart Rate (bpm): 159 Fundal Height: 32 cm Movement: Present     General:  Alert, oriented and cooperative. Patient is in no acute distress.  Skin: Skin is warm and dry. No rash noted.   Cardiovascular: Normal heart rate noted  Respiratory: Normal respiratory effort, no problems with respiration noted  Abdomen: Soft, gravid, appropriate for gestational age.  Pain/Pressure: Present     Pelvic: Cervical exam deferred        Extremities: Normal range of motion.  Edema: Mild pitting, slight indentation  Mental Status:  Normal mood and affect. Normal behavior. Normal judgment and thought content.   Assessment and Plan:  Pregnancy: [id number] at [redacted]w[redacted]d  1. [redacted] weeks gestation of pregnancy   2. Diet controlled gestational diabetes mellitus (GDM) in third trimester Pt has been unable to use lancet device, it appears the lancet does not clear the actual device.  No blood sugars to  review.  Pt advised to take device to pharmacist to troubleshoot.  If still cannot be fixed, may need to reorder new device.  3. Supervision of high risk pregnancy in third trimester Continue routine prenatal care  4. History of preterm delivery No s/sx of preterm labor  5. 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 Maternal varicella, non-immune Treat after delivery  7. Advanced maternal age in multigravida, third trimester   Preterm labor symptoms and general obstetric precautions including but not limited to vaginal bleeding, contractions, leaking of fluid and fetal movement were reviewed in detail with the patient.  Please refer to After Visit Summary for other counseling recommendations.   Return in about 2 weeks (around 12/17/2022) for [facility], in person.   Mariel Aloe, MD Faculty Attending [facility]

## 2022-12-09 ENCOUNTER — Encounter: Payer: No Typology Code available for payment source | Attending: Obstetrics & Gynecology | Admitting: Registered"

## 2022-12-09 ENCOUNTER — Encounter: Payer: Self-pay | Admitting: Registered"

## 2022-12-09 DIAGNOSIS — O24419 Gestational diabetes mellitus in pregnancy, unspecified control: Secondary | ICD-10-CM | POA: Diagnosis present

## 2022-12-09 NOTE — Progress Notes (Signed)
The following learning objectives were met by the patient during this course:   States the definition of Gestational Diabetes States why dietary management is important in controlling blood glucose Describes the effects each nutrient has on blood glucose levels Demonstrates ability to create a balanced meal plan Demonstrates carbohydrate counting  States when to check blood glucose levels Demonstrates proper blood glucose monitoring techniques States the effect of stress and exercise on blood glucose levels States the importance of limiting caffeine and abstaining from alcohol and smoking   Blood glucose monitor given: None. Patient has meter and brought to class for instruction. CBG: 66 mg/dL (fasting)   Patient instructed to monitor glucose levels: FBS: 60 - ? 95 mg/dL; 1 hr: <782 OR 2 hr: <956   Patient received handouts: Nutrition Diabetes and Pregnancy, including carb counting list and glucose log sheet   Patient will be seen for follow-up as needed.

## 2022-12-16 ENCOUNTER — Ambulatory Visit: Payer: No Typology Code available for payment source | Attending: Obstetrics

## 2022-12-16 ENCOUNTER — Ambulatory Visit: Payer: No Typology Code available for payment source | Admitting: *Deleted

## 2022-12-16 ENCOUNTER — Encounter: Payer: Self-pay | Admitting: Family Medicine

## 2022-12-16 ENCOUNTER — Ambulatory Visit (INDEPENDENT_AMBULATORY_CARE_PROVIDER_SITE_OTHER): Payer: No Typology Code available for payment source | Admitting: Family Medicine

## 2022-12-16 VITALS — BP 106/73 | HR 78 | Wt 151.4 lb

## 2022-12-16 VITALS — BP 122/69 | HR 71

## 2022-12-16 DIAGNOSIS — Z3689 Encounter for other specified antenatal screening: Secondary | ICD-10-CM | POA: Diagnosis present

## 2022-12-16 DIAGNOSIS — O0993 Supervision of high risk pregnancy, unspecified, third trimester: Secondary | ICD-10-CM

## 2022-12-16 DIAGNOSIS — O24410 Gestational diabetes mellitus in pregnancy, diet controlled: Secondary | ICD-10-CM | POA: Insufficient documentation

## 2022-12-16 DIAGNOSIS — Z3A34 34 weeks gestation of pregnancy: Secondary | ICD-10-CM

## 2022-12-16 DIAGNOSIS — O09213 Supervision of pregnancy with history of pre-term labor, third trimester: Secondary | ICD-10-CM

## 2022-12-16 DIAGNOSIS — O09523 Supervision of elderly multigravida, third trimester: Secondary | ICD-10-CM

## 2022-12-16 DIAGNOSIS — Z8751 Personal history of pre-term labor: Secondary | ICD-10-CM

## 2022-12-16 NOTE — Progress Notes (Signed)
Pt presents for ROB visit. No concerns.  

## 2022-12-16 NOTE — Progress Notes (Signed)
   PRENATAL VISIT NOTE  Subjective:  Sierra Gamble is a 37 y.o. [id number] at [redacted]w[redacted]d being seen today for ongoing prenatal care.  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  Patient reports no complaints.  Contractions: Not present. Vag. Bleeding: None.  Movement: Present. Denies leaking of fluid.   The following portions of the patient's history were reviewed and updated as appropriate: allergies, current medications, past family history, past medical history, past social history, past surgical history and problem list.   Objective:   Vitals:   12/16/22 1053  BP: 106/73  Pulse: 78  Weight: 151 lb 6.4 oz (68.7 kg)    Fetal Status:     Movement: Present     General:  Alert, oriented and cooperative. Patient is in no acute distress.  Skin: Skin is warm and dry. No rash noted.   Cardiovascular: Normal heart rate noted  Respiratory: Normal respiratory effort, no problems with respiration noted  Abdomen: Soft, gravid, appropriate for gestational age.  Pain/Pressure: Present     Pelvic: Cervical exam deferred        Extremities: Normal range of motion.  Edema: Trace  Mental Status: Normal mood and affect. Normal behavior. Normal judgment and thought content.   Assessment and Plan:  Pregnancy: A2Z3086 at [redacted]w[redacted]d 1. Supervision of high risk pregnancy in third trimester Doing well overall . Discussed anticipation for GBS and GC/Chlamydia screening at next visit.  2. Advanced maternal age in multigravida, third trimester  3. History of preterm delivery No s/sx of preterm labor  4. Diet controlled gestational diabetes mellitus (GDM) in third trimester Doing well  overall. 90% of numbers within range.  Has growth scan scheduled for later today.   5. [redacted] weeks gestation of pregnancy   Preterm labor symptoms and general obstetric precautions including but not limited to vaginal bleeding, contractions, leaking of fluid and fetal movement were reviewed in detail with the patient. Please refer to After Visit Summary for other counseling recommendations.   Return in about 2 weeks (around 12/30/2022) for [facility].  Future Appointments  Date Time Provider Department Center  12/16/2022  3:30 PM [facility] NURSE [facility]  12/16/2022  3:45 PM [facility] US4 [facility]  12/29/2022  1:30 PM Constant, Gigi Gin, MD [facility] None    Sheppard Evens MD MPH OB Fellow, Faculty Practice [facility], Center for [facility] 12/16/2022

## 2022-12-17 ENCOUNTER — Other Ambulatory Visit: Payer: Self-pay | Admitting: *Deleted

## 2022-12-17 DIAGNOSIS — O09899 Supervision of other high risk pregnancies, unspecified trimester: Secondary | ICD-10-CM

## 2022-12-17 DIAGNOSIS — O24410 Gestational diabetes mellitus in pregnancy, diet controlled: Secondary | ICD-10-CM

## 2022-12-17 DIAGNOSIS — O09523 Supervision of elderly multigravida, third trimester: Secondary | ICD-10-CM

## 2022-12-25 ENCOUNTER — Other Ambulatory Visit: Payer: Self-pay | Admitting: *Deleted

## 2022-12-25 DIAGNOSIS — O99012 Anemia complicating pregnancy, second trimester: Secondary | ICD-10-CM

## 2022-12-25 MED ORDER — ACCRUFER 30 MG PO CAPS: 1.0000 | ORAL_CAPSULE | Freq: Two times a day (BID) | ORAL | 0 refills | Status: DC

## 2022-12-25 NOTE — Progress Notes (Signed)
RX Accrufer renewed.

## 2022-12-29 ENCOUNTER — Ambulatory Visit (INDEPENDENT_AMBULATORY_CARE_PROVIDER_SITE_OTHER): Payer: No Typology Code available for payment source | Admitting: Obstetrics and Gynecology

## 2022-12-29 ENCOUNTER — Other Ambulatory Visit (HOSPITAL_COMMUNITY)
Admission: RE | Admit: 2022-12-29 | Discharge: 2022-12-29 | Disposition: A | Discharge status: Home / Self Care | Payer: No Typology Code available for payment source | Source: Ambulatory Visit | Attending: Obstetrics and Gynecology | Admitting: Obstetrics and Gynecology

## 2022-12-29 ENCOUNTER — Encounter: Payer: Self-pay | Admitting: Obstetrics and Gynecology

## 2022-12-29 VITALS — BP 116/78 | HR 72 | Wt 154.0 lb

## 2022-12-29 DIAGNOSIS — Z8751 Personal history of pre-term labor: Secondary | ICD-10-CM

## 2022-12-29 DIAGNOSIS — O0993 Supervision of high risk pregnancy, unspecified, third trimester: Secondary | ICD-10-CM

## 2022-12-29 DIAGNOSIS — O09523 Supervision of elderly multigravida, third trimester: Secondary | ICD-10-CM

## 2022-12-29 DIAGNOSIS — O24410 Gestational diabetes mellitus in pregnancy, diet controlled: Secondary | ICD-10-CM

## 2022-12-29 NOTE — Progress Notes (Signed)
   PRENATAL VISIT NOTE  Subjective:  Sierra Gamble is a 37 y.o. [id number] at [redacted]w[redacted]d being seen today for ongoing prenatal care.  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  Patient reports backache.  Contractions: Not present. Vag. Bleeding: None.  Movement: Present. Denies leaking of fluid.   The following portions of the patient's history were reviewed and updated as appropriate: allergies, current medications, past family history, past medical history, past social history, past surgical history and problem list.   Objective:   Vitals:   12/29/22 1328  BP: 116/78  Pulse: 72  Weight: 154 lb (69.9 kg)    Fetal Status: Fetal Heart Rate (bpm): 140 Fundal Height: 36 cm Movement: Present     General:  Alert, oriented and cooperative. Patient is in no acute distress.  Skin: Skin is warm and dry. No rash noted.   Cardiovascular: Normal heart rate noted  Respiratory: Normal respiratory effort, no problems with respiration noted  Abdomen: Soft, gravid, appropriate for gestational age.  Pain/Pressure: Present     Pelvic: Cervical exam performed in the presence of a chaperone Dilation: 1 Effacement (%): 50 Station: Ballotable  Extremities: Normal range of motion.  Edema: Trace  Mental Status: Normal mood and affect. Normal behavior. Normal judgment and thought content.   Assessment and Plan:  Pregnancy: A5W0981 at [redacted]w[redacted]d 1. Supervision of high risk pregnancy in third trimester Patient is doing well  Discussed back exercises and the benefits of a maternity support belt Cultures today  2. Diet controlled gestational diabetes mellitus (GDM) in third trimester Patient has  not checked CBGs since her last visit on 6/19. Discussed importance of monitoring CBGs daily to ensure euglycemia close to delivery 6/19 EFW 2258 gm (35%tile) Plan for delivery between 39-40 wks  3. History of preterm delivery   4. Advanced maternal age in multigravida, third trimester   Preterm labor symptoms and general obstetric precautions including but not limited to vaginal bleeding, contractions, leaking of fluid and fetal movement were reviewed in detail with the patient. Please refer to After Visit Summary for other counseling recommendations.   Return in about 1 week (around 01/05/2023) for in person, ROB, High risk.  No future appointments.  Catalina Antigua, MD

## 2022-12-29 NOTE — Progress Notes (Signed)
Pt states she is having increase in LE swelling, having issues with back pain and walking - esp at work. Recommended support belt, Tylenol and heat/ice.  Pt has not been checking glucose levels at home.  Last checked on the 6/19. Pt reports those readings normal.  Stressed importance of checking glucose levels.

## 2022-12-30 LAB — CERVICOVAGINAL ANCILLARY ONLY
Chlamydia: NEGATIVE
Comment: NEGATIVE
Comment: NORMAL
Neisseria Gonorrhea: NEGATIVE

## 2023-01-02 LAB — CULTURE, BETA STREP (GROUP B ONLY): Strep Gp B Culture: NEGATIVE

## 2023-01-06 ENCOUNTER — Encounter: Payer: Self-pay | Admitting: Obstetrics and Gynecology

## 2023-01-06 ENCOUNTER — Ambulatory Visit (INDEPENDENT_AMBULATORY_CARE_PROVIDER_SITE_OTHER): Payer: No Typology Code available for payment source | Admitting: Obstetrics and Gynecology

## 2023-01-06 VITALS — BP 126/80 | HR 83 | Wt 156.0 lb

## 2023-01-06 DIAGNOSIS — O0993 Supervision of high risk pregnancy, unspecified, third trimester: Secondary | ICD-10-CM

## 2023-01-06 DIAGNOSIS — D649 Anemia, unspecified: Secondary | ICD-10-CM

## 2023-01-06 DIAGNOSIS — O09523 Supervision of elderly multigravida, third trimester: Secondary | ICD-10-CM

## 2023-01-06 DIAGNOSIS — Z3A37 37 weeks gestation of pregnancy: Secondary | ICD-10-CM

## 2023-01-06 DIAGNOSIS — O24410 Gestational diabetes mellitus in pregnancy, diet controlled: Secondary | ICD-10-CM

## 2023-01-06 DIAGNOSIS — Z8751 Personal history of pre-term labor: Secondary | ICD-10-CM

## 2023-01-07 ENCOUNTER — Encounter (HOSPITAL_COMMUNITY): Payer: Self-pay

## 2023-01-07 DIAGNOSIS — D649 Anemia, unspecified: Secondary | ICD-10-CM | POA: Insufficient documentation

## 2023-01-07 NOTE — Progress Notes (Addendum)
   PRENATAL VISIT NOTE  Subjective:  Sierra Gamble is a 37 y.o. O1H0865 at [redacted]w[redacted]d being seen today for ongoing prenatal care.  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  Patient reports no complaints.  Contractions: Not present. Vag. Bleeding: None.  Movement: Present. Denies leaking of fluid.   The following portions of the patient's history were reviewed and updated as appropriate: allergies, current medications, past family history, past medical history, past social history, past surgical history and problem list.   Objective:   Vitals:   01/06/23 1340  BP: 126/80  Pulse: 83  Weight: 156 lb (70.8 kg)   Fetal Status: Fetal Heart Rate (bpm): 130   Movement: Present     General:  Alert, oriented and cooperative. Patient is in no acute distress.  Skin: Skin is warm and dry. No rash noted.   Cardiovascular: Normal heart rate noted  Respiratory: Normal respiratory effort, no problems with respiration noted  Abdomen: Soft, gravid, appropriate for gestational age.  Pain/Pressure: Absent      Assessment and Plan:  Pregnancy: [id number] at [redacted]w[redacted]d 1. Supervision of high risk pregnancy in third trimester 2. [redacted] weeks gestation of pregnancy IOL ordered for 39w for GDM  3. Diet controlled gestational diabetes mellitus (GDM) in third trimester Checking sugar very rarely. Meter reviewed and values all at goal, but checking a couple times per week Counseled on importance of checking even if numbers are usually normal. We reviewed that fasting number does not need to be a specific time of day, can get as soon as she wakes up in the AM even if it's at different times. Also discussed checking at least after  dinner if she is only eating dinner. Reviewed that she can check 1h PP if that is more convenient at night Last growth @ 34/0 2258g (35%), AC 58%. Next growth scheduled 7/18 IOL ordered for 39w  4. Advanced maternal age in multigravida, third trimester  5. History of preterm delivery  6. Anemia PO iron Consider rechecking Hgb & ferritin at next appt (hemoglobinopathy carrier)  Please refer to After Visit Summary for other counseling recommendations.   Return in about 1 week (around 01/13/2023) for return OB at 38 weeks.  Future Appointments  Date Time Provider Department Center  01/12/2023  1:30 PM Warden Fillers, MD [facility] None  01/14/2023  7:45 AM [facility] US7 [facility]  01/20/2023  1:30 PM Lennart Pall, MD [facility] None   Lennart Pall, MD

## 2023-01-07 NOTE — Addendum Note (Signed)
Addended by: Harvie Bridge on: 01/07/2023 08:14 AM   Modules accepted: Orders

## 2023-01-08 ENCOUNTER — Encounter (HOSPITAL_COMMUNITY): Payer: Self-pay | Admitting: *Deleted

## 2023-01-08 ENCOUNTER — Telehealth (HOSPITAL_COMMUNITY): Payer: Self-pay | Admitting: *Deleted

## 2023-01-08 NOTE — Telephone Encounter (Signed)
Preadmission screen  

## 2023-01-12 ENCOUNTER — Ambulatory Visit (INDEPENDENT_AMBULATORY_CARE_PROVIDER_SITE_OTHER): Payer: No Typology Code available for payment source | Admitting: Obstetrics and Gynecology

## 2023-01-12 VITALS — BP 133/82 | HR 65 | Wt 157.3 lb

## 2023-01-12 DIAGNOSIS — O09899 Supervision of other high risk pregnancies, unspecified trimester: Secondary | ICD-10-CM

## 2023-01-12 DIAGNOSIS — O24410 Gestational diabetes mellitus in pregnancy, diet controlled: Secondary | ICD-10-CM

## 2023-01-12 DIAGNOSIS — O09523 Supervision of elderly multigravida, third trimester: Secondary | ICD-10-CM

## 2023-01-12 DIAGNOSIS — Z3A37 37 weeks gestation of pregnancy: Secondary | ICD-10-CM

## 2023-01-12 DIAGNOSIS — O0993 Supervision of high risk pregnancy, unspecified, third trimester: Secondary | ICD-10-CM

## 2023-01-12 DIAGNOSIS — Z2839 Other underimmunization status: Secondary | ICD-10-CM

## 2023-01-12 NOTE — Progress Notes (Signed)
   PRENATAL VISIT NOTE  Subjective:  Sierra Gamble is a 37 y.o. [id number] at [redacted]w[redacted]d being seen today for ongoing prenatal care.  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  Patient doing well with no acute concerns today. She reports no complaints.  Contractions: Not present. Vag. Bleeding: None.  Movement: Present. Denies leaking of fluid.   The following portions of the patient's history were reviewed and updated as appropriate: allergies, current medications, past family history, past medical history, past social history, past surgical history and problem list. Problem list updated.  Objective:   Vitals:   01/12/23 1337  BP: 133/82  Pulse: 65  Weight: 157 lb 4.8 oz (71.4 kg)    Fetal Status: Fetal Heart Rate (bpm): 138 Fundal Height: 38 cm Movement: Present     General:  Alert, oriented and cooperative. Patient is in no acute distress.  Skin: Skin is warm and dry. No rash noted.   Cardiovascular: Normal heart rate noted  Respiratory: Normal respiratory effort, no problems with respiration noted  Abdomen: Soft, gravid, appropriate for gestational age.  Pain/Pressure: Present (with standing)     Pelvic: Cervical exam deferred        Extremities: Normal range of motion.  Edema: Trace  Mental Status:  Normal mood and affect. Normal behavior. Normal judgment and thought content.   Assessment and Plan:  Pregnancy: E3P2951 at [redacted]w[redacted]d  1. [redacted] weeks gestation of pregnancy   2. Diet controlled gestational diabetes mellitus (GDM) in third trimester FBS: 71-79 PPBS: 68-142, most in range IOL at 39 weeks, already scheduled  3. Supervision of high risk pregnancy in third trimester Delivery  scheduled  4. Maternal varicella, non-immune Consider vaccination post delivery  5. Advanced maternal age in multigravida, third trimester   Term labor symptoms and general obstetric precautions including but not limited to vaginal bleeding, contractions, leaking of fluid and fetal movement were reviewed in detail with the patient.  Please refer to After Visit Summary for other counseling recommendations.   No follow-ups on file.   Mariel Aloe, MD Faculty Attending Center for [facility]

## 2023-01-14 ENCOUNTER — Ambulatory Visit: Payer: No Typology Code available for payment source | Attending: Maternal & Fetal Medicine

## 2023-01-14 DIAGNOSIS — O09523 Supervision of elderly multigravida, third trimester: Secondary | ICD-10-CM | POA: Insufficient documentation

## 2023-01-14 DIAGNOSIS — O24410 Gestational diabetes mellitus in pregnancy, diet controlled: Secondary | ICD-10-CM | POA: Insufficient documentation

## 2023-01-14 DIAGNOSIS — O09899 Supervision of other high risk pregnancies, unspecified trimester: Secondary | ICD-10-CM | POA: Insufficient documentation

## 2023-01-14 DIAGNOSIS — O09213 Supervision of pregnancy with history of pre-term labor, third trimester: Secondary | ICD-10-CM

## 2023-01-14 DIAGNOSIS — Z3A38 38 weeks gestation of pregnancy: Secondary | ICD-10-CM | POA: Diagnosis not present

## 2023-01-19 ENCOUNTER — Other Ambulatory Visit: Payer: Self-pay

## 2023-01-19 ENCOUNTER — Inpatient Hospital Stay (HOSPITAL_COMMUNITY)
Admission: AD | Admit: 2023-01-19 | Discharge: 2023-01-20 | DRG: 807 | Disposition: A | Discharge status: Home / Self Care | Payer: No Typology Code available for payment source | Attending: Family Medicine | Admitting: Family Medicine

## 2023-01-19 ENCOUNTER — Encounter (HOSPITAL_COMMUNITY): Payer: Self-pay | Admitting: Obstetrics and Gynecology

## 2023-01-19 DIAGNOSIS — Z2839 Other underimmunization status: Secondary | ICD-10-CM

## 2023-01-19 DIAGNOSIS — O24419 Gestational diabetes mellitus in pregnancy, unspecified control: Secondary | ICD-10-CM | POA: Diagnosis present

## 2023-01-19 DIAGNOSIS — O24420 Gestational diabetes mellitus in childbirth, diet controlled: Principal | ICD-10-CM | POA: Diagnosis present

## 2023-01-19 DIAGNOSIS — O09523 Supervision of elderly multigravida, third trimester: Secondary | ICD-10-CM | POA: Diagnosis present

## 2023-01-19 DIAGNOSIS — O165 Unspecified maternal hypertension, complicating the puerperium: Secondary | ICD-10-CM | POA: Insufficient documentation

## 2023-01-19 DIAGNOSIS — O09899 Supervision of other high risk pregnancies, unspecified trimester: Secondary | ICD-10-CM

## 2023-01-19 DIAGNOSIS — O0993 Supervision of high risk pregnancy, unspecified, third trimester: Principal | ICD-10-CM

## 2023-01-19 DIAGNOSIS — O0990 Supervision of high risk pregnancy, unspecified, unspecified trimester: Secondary | ICD-10-CM

## 2023-01-19 DIAGNOSIS — O9902 Anemia complicating childbirth: Secondary | ICD-10-CM | POA: Diagnosis present

## 2023-01-19 DIAGNOSIS — O26893 Other specified pregnancy related conditions, third trimester: Secondary | ICD-10-CM | POA: Diagnosis present

## 2023-01-19 DIAGNOSIS — O43123 Velamentous insertion of umbilical cord, third trimester: Secondary | ICD-10-CM | POA: Diagnosis present

## 2023-01-19 DIAGNOSIS — Z8751 Personal history of pre-term labor: Secondary | ICD-10-CM

## 2023-01-19 DIAGNOSIS — Z8616 Personal history of COVID-19: Secondary | ICD-10-CM

## 2023-01-19 DIAGNOSIS — O24410 Gestational diabetes mellitus in pregnancy, diet controlled: Secondary | ICD-10-CM

## 2023-01-19 DIAGNOSIS — Z148 Genetic carrier of other disease: Secondary | ICD-10-CM

## 2023-01-19 DIAGNOSIS — Z3A38 38 weeks gestation of pregnancy: Secondary | ICD-10-CM

## 2023-01-19 LAB — CBC
HCT: 31.9 % — ABNORMAL LOW (ref 36.0–46.0)
Hemoglobin: 10.5 g/dL — ABNORMAL LOW (ref 12.0–15.0)
MCH: 25.7 pg — ABNORMAL LOW (ref 26.0–34.0)
MCHC: 32.9 g/dL (ref 30.0–36.0)
MCV: 78 fL — ABNORMAL LOW (ref 80.0–100.0)
Platelets: 286 10*3/uL (ref 150–400)
RBC: 4.09 MIL/uL (ref 3.87–5.11)
RDW: 15.6 % — ABNORMAL HIGH (ref 11.5–15.5)
WBC: 10.9 10*3/uL — ABNORMAL HIGH (ref 4.0–10.5)
nRBC: 0 % (ref 0.0–0.2)

## 2023-01-19 LAB — TYPE AND SCREEN
ABO/RH(D): O POS
Antibody Screen: NEGATIVE

## 2023-01-19 LAB — RPR: RPR Ser Ql: NONREACTIVE

## 2023-01-19 MED ORDER — DIBUCAINE (PERIANAL) 1 % EX OINT: 1.0000 | TOPICAL_OINTMENT | CUTANEOUS | Status: DC | PRN

## 2023-01-19 MED ORDER — BENZOCAINE-MENTHOL 20-0.5 % EX AERO: 1.0000 | INHALATION_SPRAY | CUTANEOUS | Status: DC | PRN

## 2023-01-19 MED ORDER — TETANUS-DIPHTH-ACELL PERTUSSIS 5-2.5-18.5 LF-MCG/0.5 IM SUSY: 0.5000 mL | PREFILLED_SYRINGE | Freq: Once | INTRAMUSCULAR | Status: DC

## 2023-01-19 MED ORDER — ONDANSETRON HCL 4 MG PO TABS: 4.0000 mg | ORAL_TABLET | ORAL | Status: DC | PRN

## 2023-01-19 MED ORDER — ONDANSETRON HCL 4 MG/2ML IJ SOLN: 4.0000 mg | Freq: Four times a day (QID) | INTRAMUSCULAR | Status: DC | PRN

## 2023-01-19 MED ORDER — DIPHENHYDRAMINE HCL 25 MG PO CAPS: 25.0000 mg | ORAL_CAPSULE | Freq: Four times a day (QID) | ORAL | Status: DC | PRN

## 2023-01-19 MED ORDER — IBUPROFEN 600 MG PO TABS
600.0000 mg | ORAL_TABLET | Freq: Four times a day (QID) | ORAL | Status: DC
  Administered 2023-01-19 – 2023-01-20 (×5): 600 mg via ORAL
  Filled 2023-01-19 (×5): qty 1

## 2023-01-19 MED ORDER — SOD CITRATE-CITRIC ACID 500-334 MG/5ML PO SOLN: 30.0000 mL | ORAL | Status: DC | PRN

## 2023-01-19 MED ORDER — OXYTOCIN BOLUS FROM INFUSION
333.0000 mL | Freq: Once | INTRAVENOUS | Status: AC
  Administered 2023-01-19: 333 mL via INTRAVENOUS

## 2023-01-19 MED ORDER — ACETAMINOPHEN 325 MG PO TABS: 650.0000 mg | ORAL_TABLET | ORAL | Status: DC | PRN

## 2023-01-19 MED ORDER — LACTATED RINGERS IV SOLN: INTRAVENOUS | Status: DC

## 2023-01-19 MED ORDER — SIMETHICONE 80 MG PO CHEW: 80.0000 mg | CHEWABLE_TABLET | ORAL | Status: DC | PRN

## 2023-01-19 MED ORDER — ONDANSETRON HCL 4 MG/2ML IJ SOLN: 4.0000 mg | INTRAMUSCULAR | Status: DC | PRN

## 2023-01-19 MED ORDER — OXYTOCIN-SODIUM CHLORIDE 30-0.9 UT/500ML-% IV SOLN
2.5000 [IU]/h | INTRAVENOUS | Status: DC
  Administered 2023-01-19: 2.5 [IU]/h via INTRAVENOUS
  Filled 2023-01-19: qty 500

## 2023-01-19 MED ORDER — FENTANYL CITRATE (PF) 100 MCG/2ML IJ SOLN: 50.0000 ug | INTRAMUSCULAR | Status: DC | PRN

## 2023-01-19 MED ORDER — COCONUT OIL OIL: 1.0000 | TOPICAL_OIL | Status: DC | PRN

## 2023-01-19 MED ORDER — PRENATAL MULTIVITAMIN CH
1.0000 | ORAL_TABLET | Freq: Every day | ORAL | Status: DC
  Administered 2023-01-19 – 2023-01-20 (×2): 1 via ORAL
  Filled 2023-01-19 (×2): qty 1

## 2023-01-19 MED ORDER — WITCH HAZEL-GLYCERIN EX PADS: 1.0000 | MEDICATED_PAD | CUTANEOUS | Status: DC | PRN

## 2023-01-19 MED ORDER — ZOLPIDEM TARTRATE 5 MG PO TABS: 5.0000 mg | ORAL_TABLET | Freq: Every evening | ORAL | Status: DC | PRN

## 2023-01-19 MED ORDER — LACTATED RINGERS IV SOLN: 500.0000 mL | INTRAVENOUS | Status: DC | PRN

## 2023-01-19 MED ORDER — SENNOSIDES-DOCUSATE SODIUM 8.6-50 MG PO TABS
2.0000 | ORAL_TABLET | Freq: Every day | ORAL | Status: DC
  Administered 2023-01-20: 2 via ORAL
  Filled 2023-01-19: qty 2

## 2023-01-19 MED ORDER — LIDOCAINE HCL (PF) 1 % IJ SOLN: 30.0000 mL | INTRAMUSCULAR | Status: DC | PRN

## 2023-01-19 NOTE — MAU Note (Signed)
Dorathy Kinsman, CNM, assisted transport of patient to room 207.

## 2023-01-19 NOTE — Discharge Summary (Signed)
Postpartum Discharge Summary  Date of Service updated***     Patient Name: Sierra Gamble DOB: 07-02-1985 MRN: [id number]  Date of admission: 01/19/2023 Delivery date:01/19/2023 Delivering provider: Lavonda Jumbo Date of discharge: 01/19/2023  Admitting diagnosis: Encounter for induction of labor [Z34.90] Intrauterine pregnancy: [redacted]w[redacted]d     Secondary diagnosis:  Principal Problem:   Labor and delivery indication for care or intervention  Additional problems: ***    Discharge diagnosis: {DX.:23714}                                              Post partum procedures:{Postpartum procedures:23558} Augmentation: AROM Complications: None  Hospital course: Onset of Labor With Vaginal Delivery      37 y.o. yo [id number] at [redacted]w[redacted]d was admitted in Active Labor on 01/19/2023. Labor course was complicated by nothing.  Membrane Rupture Time/Date: 8:33 AM,01/19/2023  Delivery Method:Vaginal, Spontaneous Episiotomy: None Lacerations:  None Patient had a postpartum course complicated by ***.  She is ambulating, tolerating a regular diet, passing flatus, and urinating well. Patient is discharged home in stable condition on 01/19/23.  Newborn Data: Birth date:01/19/2023 Birth time:9:44 AM Gender:Female Living status:Living Apgars: ,  Weight:   Magnesium Sulfate received: No BMZ received: No Rhophylac:No MMR:{MMR:30440033} T-DaP:{Tdap:23962} Flu: {JYN:82956} Transfusion:{Transfusion received:30440034}  Physical exam  Vitals:   01/19/23 0809 01/19/23 0822 01/19/23 0823  BP: (!) 149/88  (!) 144/96  Pulse: 83  78  Resp: 14  16  Temp: 97.8 F (36.6 C)  97.8 F (36.6 C)  TempSrc: Oral  Oral  Weight:  70.8 kg   Height:  5' (1.524 m)    General: {Exam; general:21111117} Lochia: {Desc; appropriate/inappropriate:30686::"appropriate"} Uterine Fundus: {Desc; firm/soft:30687} Incision: {Exam; incision:21111123} DVT Evaluation: {Exam; OZH:0865784} Labs: Lab Results  Component Value Date    WBC 10.9 (H) 01/19/2023   HGB 10.5 (L) 01/19/2023   HCT 31.9 (L) 01/19/2023   MCV 78.0 (L) 01/19/2023   PLT 286 01/19/2023      Latest Ref Rng & Units 06/17/2019    2:01 PM  CMP  Creatinine 0.44 - 1.00 mg/dL 6.96    Edinburgh Score:    07/20/2019   10:00 AM  Edinburgh Postnatal Depression Scale Screening Tool  I have been able to laugh and see the funny side of things. 0  I have looked forward with enjoyment to things. 0  I have been anxious or worried for no good reason. 0  I have felt scared or panicky for no good reason. 0  Things have been getting on top of me. 0  I have been so unhappy that I have had difficulty sleeping. 0  I have felt sad or miserable. 0  I have been so unhappy that I have been crying. 0  The thought of harming myself has occurred to me. 0     After visit meds:  Allergies as of 01/19/2023   No Known Allergies   Med Rec must be completed prior to using this [facility]***        Discharge home in stable condition Infant Feeding: {Baby feeding:23562} Infant Disposition:{CHL IP OB HOME WITH EXBMWU:13244} Discharge instruction: per After Visit Summary and Postpartum booklet. Activity: Advance as tolerated. Pelvic rest for 6 weeks.  Diet: {OB WNUU:72536644} Future Appointments:No future appointments. Follow up Visit:  Message sent to [facility] by Autry-Lott on 01/19/2023  Please schedule this patient for  a In person postpartum visit in 6 weeks with the following provider: MD and APP. Additional Postpartum F/U:2 hour GTT in 6 weeks High risk pregnancy complicated by:  AMA, A1GDM, Varicella NI Delivery mode:  Vaginal, Spontaneous Anticipated Birth Control:  POPs   01/19/2023 Simone Autry-Lott, DO

## 2023-01-19 NOTE — H&P (Signed)
OBSTETRIC ADMISSION HISTORY AND PHYSICAL  Sierra Gamble is a 37 y.o. female [phone] with IUP at [redacted]w[redacted]d by LMP presenting for SOL. She reports +FMs, No LOF, no VB, no blurry vision, headaches or peripheral edema, and RUQ pain.  She plans on breast feeding. She request POPs for birth control. She received her prenatal care at  [facility]    Dating: By LMP --->  Estimated Date of Delivery: 01/27/23  Sono:    @[redacted]w[redacted]d , CWD, normal anatomy, cephalic presentation, right lateral placental lie, 3004g, 26% EFW   Prenatal History/Complications:  -AMA -GDM -hgb E carrier -Varicella NI  Past Medical History: Past Medical History:  Diagnosis Date   Anemia    COVID-19 06/17/2019   GDM (gestational diabetes mellitus), class A1 04/13/2019   Gestational diabetes    Poor fetal growth affecting management of mother in third trimester 04/06/2019   [ ]  f/u rpt imaging in two and three weeks     10/7: efw 6%, ac 16%, nl s/d and afi       Past Surgical History: Past Surgical History:  Procedure Laterality Date   NO PAST SURGERIES      Obstetrical History: OB History     Gravida  5   Para  3   Term  2   Preterm  1   AB  1   Living  3      SAB  1   IAB      Ectopic      Multiple  0   Live Births  3           Social History Social History   Socioeconomic History   Marital status: Single    Spouse name: Not on file   Number of children: Not on file   Years of education: Not on file   Highest education level: Not on file  Occupational History   Not on file  Tobacco Use   Smoking status: Never   Smokeless tobacco: Never  Vaping Use   Vaping status: Never Used  Substance and Sexual Activity   Alcohol use: No   Drug use: No   Sexual activity: Yes    Partners: Male    Birth control/protection: None  Other Topics Concern   Not on file  Social History Narrative   Not on file   Social Determinants of Health   Financial Resource Strain: Not on file  Food Insecurity:  No Food Insecurity (12/09/2022)   Hunger Vital Sign    Worried About Running Out of Food in the Last Year: Never true    Ran Out of Food in the Last Year: Never true  Transportation Needs: No Transportation Needs (11/23/2018)   PRAPARE - Administrator, Civil Service (Medical): No    Lack of Transportation (Non-Medical): No  Physical Activity: Not on file  Stress: Not on file  Social Connections: Not on file    Family History: Family History  Problem Relation Age of Onset   Thyroid disease Mother    Stroke Mother    Hyperlipidemia Mother    COPD Father    Diabetes Maternal Grandmother    Thyroid disease Maternal Grandmother     Allergies: No Known Allergies  Medications Prior to Admission  Medication Sig Dispense Refill Last Dose   Prenatal MV & Min w/FA-DHA (PRENATAL GUMMIES PO) Take 2 tablets by mouth daily.   01/18/2023   Accu-Chek Softclix Lancets lancets 1 each by Other route 4 (four) times daily.  100 each 12    Blood Glucose Monitoring Suppl (ACCU-CHEK GUIDE) w/Device KIT 1 Device by Does not apply route 4 (four) times daily. 1 kit 0    Ferric Maltol (ACCRUFER) 30 MG CAPS Take 1 capsule (30 mg total) by mouth 2 (two) times daily before a meal. Take 2 hrs before, or 2 hrs after a meal. 60 capsule 0    glucose blood (ACCU-CHEK GUIDE) test strip Use to check blood sugars four times a day was instructed 50 each 12      Review of Systems   All systems reviewed and negative except as stated in HPI  Blood pressure (!) 149/88, pulse 83, temperature 97.8 F (36.6 C), temperature source Oral, resp. rate 14, height 5' (1.524 m), weight 70.8 kg, last menstrual period 04/22/2022, unknown if currently breastfeeding. General appearance: alert and no distress Lungs: normal effort Heart: regular rate noted Abdomen: gravid Extremities: No LE edema Presentation: cephalic Fetal monitoringBaseline: 145 bpm, Variability: Good {> 6 bpm), Accelerations: Reactive, and  Decelerations: Absent Uterine activity CTX every 2 mins Dilation: 9 Exam by:: Dorathy Kinsman, CNM   Prenatal labs: ABO, Rh: O/Positive/-- (02/23 0932) Antibody: Negative (02/23 0928) Rubella: 2.01 (02/23 0928) RPR: Non Reactive (05/07 0811)  HBsAg: Negative (02/23 0928)  HIV: Non Reactive (05/07 0811)  GBS: Negative/-- (07/02 1433)  1 hr Glucola 209 Genetic screening  LR, hemoglobinopathy B Anatomy US wnl, female  Prenatal Transfer Tool  Maternal Diabetes: Yes:  Diabetes Type:  Diet controlled Genetic Screening: Normal hgb B carrier Maternal Ultrasounds/Referrals: Normal Fetal Ultrasounds or other Referrals:  None Maternal Substance Abuse:  No Significant Maternal Medications:  None Significant Maternal Lab Results:  Group B Strep negative and Other: Varicella NI Number of Prenatal Visits:greater than 3 verified prenatal visits Other Comments:  None  No results found for this or any previous visit (from the past 24 hour(s)).  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 T5T7322 at [redacted]w[redacted]d here for SOL.   #Labor: Expectant, AROM when appropriate #Pain: Maternally supported #FWB: Cat I  #ID: GBS neg #MOF:  Breast #MOC: POPs #Circ:  N/a, girl  Sierra Kirley Autry-Lott, DO  01/19/2023, 8:23 AM

## 2023-01-19 NOTE — OB Triage Provider Note (Addendum)
First Provider Initiated Contact with Patient 01/19/2023 at [phone].   S: [name] is a 37 y.o. [phone] at [redacted]w[redacted]d  who presents to MAU today complaining regular, strong contractions. She denies vaginal bleeding. She denies LOF. She reports normal fetal movement. Elevated BP noted. Brief review of chart showed no Hx HTN this pregnancy.     O: BP (!) 144/96   Pulse 78   Temp 97.8 F (36.6 C) (Oral)   Resp 16   Ht 5' (1.524 m)   Wt 70.8 kg   LMP 04/22/2022   BMI 30.47 kg/m  GENERAL: Well-developed, well-nourished female in no acute distress.  HEAD: Normocephalic, atraumatic.  CHEST: Normal effort of breathing, regular heart rate ABDOMEN: Soft, nontender, gravid  Cervical exam:  Dilation: 9 Effacement (%): 90 Cervical Position: Anterior Station: -1 BBOW Presentation: Vertex Exam by:: Dorathy Kinsman, CNM   Fetal Monitoring: Baseline: 140 Variability: mod Accelerations: 15x15 Decelerations: none Contractions: Q2-3, strong  A: SIUP at [redacted]w[redacted]d  Active labor/transition Elevated BP w/out Dx GHTN.   P: Admit to L&D CNM accompanied pt to unit due to advanced labor. Pt without urge to push.  Report given to Dr. Salvadore Dom.  Pre-E work-up  Sierra Gamble [place] 01/19/2023 8:55 AM

## 2023-01-19 NOTE — Lactation Note (Signed)
This note was copied from a baby's chart. Lactation Consultation Note  Patient Name: Sierra Gamble [id number] Date: 01/19/2023 Age:38 hours Reason for consult: Initial assessment;Early term 37-38.6wks;Maternal endocrine disorder  P4, Baby was latched when [facility] entered room.  Intermittent swallows observed.  Baby unlatched.  Assisted with placing pillow under baby to bring to breast height and turned baby tummy to tummy.  Baby latched on L breast with ease.  Feed on demand with cues.  Showed mother how to watch for swallows. Goal 8-12+ times per day after first 24 hrs.  Place baby STS if not cueing. Mother is concerned about her milk supply.  Discussed feeding on demand on both breasts to stimulate her supply.  Stork pump paperwork emailed.   Maternal Data Has patient been taught Hand Expression?: Yes  Feeding Mother's Current Feeding Choice: Breast Milk  LATCH Score Latch: Grasps breast easily, tongue down, lips flanged, rhythmical sucking.  Audible Swallowing: A few with stimulation  Type of Nipple: Everted at rest and after stimulation  Comfort (Breast/Nipple): Soft / non-tender  Hold (Positioning): Assistance needed to correctly position infant at breast and maintain latch.  LATCH Score: 8   Interventions Interventions: Breast feeding basics reviewed;Support pillows;Education;LC Services brochure  Discharge Pump: Stork Pump (paperwork emailed)  Consult Status Consult Status: Follow-up Date: 01/20/23 Follow-up type: In-patient    Dahlia Byes [facility] 01/19/2023, 2:11 PM

## 2023-01-19 NOTE — MAU Note (Signed)
.  Sierra Gamble is a 37 y.o. at [redacted]w[redacted]d here in MAU reporting: ctx every 4-5 minutes since 0500 this morning. Denies VB or LOF. +FM.   Pain score: 10 Vitals:   01/19/23 0809  BP: (!) 149/88  Pulse: 83  Resp: 14  Temp: 97.8 F (36.6 C)     FHT:141

## 2023-01-20 ENCOUNTER — Other Ambulatory Visit (HOSPITAL_COMMUNITY): Payer: Self-pay

## 2023-01-20 ENCOUNTER — Inpatient Hospital Stay (HOSPITAL_COMMUNITY): Payer: No Typology Code available for payment source

## 2023-01-20 ENCOUNTER — Encounter: Payer: No Typology Code available for payment source | Admitting: Obstetrics and Gynecology

## 2023-01-20 ENCOUNTER — Inpatient Hospital Stay (HOSPITAL_COMMUNITY)
Admission: RE | Admit: 2023-01-20 | Payer: No Typology Code available for payment source | Source: Home / Self Care | Admitting: Obstetrics & Gynecology

## 2023-01-20 DIAGNOSIS — O165 Unspecified maternal hypertension, complicating the puerperium: Secondary | ICD-10-CM

## 2023-01-20 HISTORY — DX: Unspecified maternal hypertension, complicating the puerperium: O16.5

## 2023-01-20 MED ORDER — NORETHINDRONE 0.35 MG PO TABS
1.0000 | ORAL_TABLET | Freq: Every day | ORAL | 11 refills | Status: DC
  Filled 2023-01-20: qty 28, 28d supply, fill #0

## 2023-01-20 MED ORDER — IBUPROFEN 600 MG PO TABS
600.0000 mg | ORAL_TABLET | Freq: Four times a day (QID) | ORAL | 0 refills | Status: DC
  Filled 2023-01-20: qty 30, 8d supply, fill #0

## 2023-01-20 MED ORDER — FUROSEMIDE 20 MG PO TABS
20.0000 mg | ORAL_TABLET | Freq: Every day | ORAL | 0 refills | Status: DC
  Filled 2023-01-20: qty 5, 5d supply, fill #0

## 2023-01-20 MED ORDER — ACETAMINOPHEN 325 MG PO TABS
650.0000 mg | ORAL_TABLET | ORAL | 0 refills | Status: DC | PRN
  Filled 2023-01-20: qty 100, 9d supply, fill #0

## 2023-01-20 MED ORDER — NIFEDIPINE ER 30 MG PO TB24
30.0000 mg | ORAL_TABLET | Freq: Every day | ORAL | 11 refills | Status: DC
  Filled 2023-01-20: qty 30, 30d supply, fill #0

## 2023-01-20 NOTE — Progress Notes (Signed)
Discharge instructions reviewed with patient and SO. Pt verbalized understanding. Infant placed in car seat per parent. ID bands matched, umbilical clamp and security tag removed. Patient and infant discharged home in stable condition with all personal belongings. Pt requested to walk out to car.

## 2023-01-25 ENCOUNTER — Other Ambulatory Visit: Payer: Self-pay | Admitting: General Practice

## 2023-01-25 DIAGNOSIS — O99012 Anemia complicating pregnancy, second trimester: Secondary | ICD-10-CM

## 2023-01-25 MED ORDER — ACCRUFER 30 MG PO CAPS: 1.0000 | ORAL_CAPSULE | Freq: Two times a day (BID) | ORAL | 0 refills | Status: DC

## 2023-01-27 ENCOUNTER — Ambulatory Visit: Payer: No Typology Code available for payment source | Admitting: Emergency Medicine

## 2023-01-27 VITALS — BP 114/72 | HR 81 | Wt 139.0 lb

## 2023-01-27 DIAGNOSIS — O165 Unspecified maternal hypertension, complicating the puerperium: Secondary | ICD-10-CM

## 2023-01-27 NOTE — Progress Notes (Signed)
Pt presents for PP BP check. BP 114/72 in office. Denies HA, vision change, swelling or epigastric pain.   Advised to continue Nifedipine 30mg  daily as prescribed. F/u at 6 week PP apt.

## 2023-03-04 ENCOUNTER — Other Ambulatory Visit: Payer: No Typology Code available for payment source

## 2023-03-04 ENCOUNTER — Ambulatory Visit: Payer: No Typology Code available for payment source | Admitting: Obstetrics & Gynecology

## 2023-03-10 ENCOUNTER — Other Ambulatory Visit: Payer: No Typology Code available for payment source

## 2023-03-10 ENCOUNTER — Ambulatory Visit: Payer: No Typology Code available for payment source | Admitting: Obstetrics and Gynecology

## 2023-04-14 ENCOUNTER — Other Ambulatory Visit (HOSPITAL_COMMUNITY): Payer: Self-pay

## 2023-04-16 ENCOUNTER — Ambulatory Visit (INDEPENDENT_AMBULATORY_CARE_PROVIDER_SITE_OTHER): Payer: No Typology Code available for payment source | Admitting: Obstetrics and Gynecology

## 2023-04-16 ENCOUNTER — Encounter: Payer: Self-pay | Admitting: Obstetrics and Gynecology

## 2023-04-16 ENCOUNTER — Other Ambulatory Visit: Payer: No Typology Code available for payment source

## 2023-04-16 DIAGNOSIS — Z8632 Personal history of gestational diabetes: Secondary | ICD-10-CM | POA: Diagnosis not present

## 2023-04-16 DIAGNOSIS — O165 Unspecified maternal hypertension, complicating the puerperium: Secondary | ICD-10-CM

## 2023-04-16 DIAGNOSIS — Z23 Encounter for immunization: Secondary | ICD-10-CM

## 2023-04-16 MED ORDER — NIFEDIPINE ER 30 MG PO TB24: 30.0000 mg | ORAL_TABLET | Freq: Every day | ORAL | 3 refills | Status: DC

## 2023-04-16 NOTE — Progress Notes (Signed)
Post Partum Visit Note  Sierra Gamble is a 37 y.o. [id number] female who presents for a postpartum visit. She is 3  months  postpartum following a normal spontaneous vaginal delivery.  I have fully reviewed the prenatal and intrapartum course. The delivery was at 38.6 gestational weeks.  Anesthesia: none. Postpartum course has been uncomplicated. Baby is doing well. Baby is feeding by bottle - Similac Total . Bleeding no bleeding. Bowel function is normal. Bladder function is normal. Patient is not sexually active. Contraception method is oral progesterone-only contraceptive.   Gets 20 weeks for parental leave through her employer ([employer]).   Postpartum depression screening: negative, score 1.  Upstream - 04/16/23 0811       Contraception Wrap Up   Current Method Oral Contraceptive    End Method Oral Contraceptive    How was the end contraceptive method provided? N/A            The pregnancy intention screening data noted above was reviewed. Potential methods of contraception were discussed. The patient elected to proceed with Oral Contraceptive.   Edinburgh Postnatal Depression Scale - 04/16/23 0831       Edinburgh Postnatal Depression Scale:  In the Past 7 Days   I have been able to laugh and see the funny side of things. 0    I have looked forward with enjoyment to things. 0    I have blamed myself unnecessarily when things went wrong. 0    I have been anxious or worried for no good reason. 0    I have felt scared or panicky for no good reason. 0    Things have been getting on top of me. 1    I have been so unhappy that I have had difficulty sleeping. 0    I have felt sad or miserable. 0    I have been so unhappy that I have been crying. 0    The thought of harming myself has occurred to me. 0    Edinburgh Postnatal Depression Scale Total 1            Health Maintenance Due  Topic Date Due   COVID-19 Vaccine (1) Never done   INFLUENZA VACCINE  01/28/2023   The  following portions of the patient's history were reviewed and updated as appropriate: allergies, current medications, past family history, past medical history, past social history, past surgical history, and problem list.  Review of Systems Pertinent items are noted in HPI.  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  Plan:   Essential components of care per ACOG recommendations:  1.  Mood and well being: Patient with negative depression screening today. Reviewed local resources for support.  - Patient tobacco use? No.   - hx of drug use? No.    2. Infant  care and feeding:  -Patient currently breastmilk feeding? No.  -Social determinants of health (SDOH) reviewed in EPIC. No concerns  3. Sexuality, contraception and birth spacing - Patient does want a pregnancy in the next year.  Desired family size is 5 children.  - Reviewed reproductive life planning. Reviewed contraceptive methods based on pt preferences and effectiveness.  Patient desired Oral Contraceptive today.   - Discussed birth spacing of 18 months  4. Sleep and fatigue -Encouraged family/partner/community support of 4 hrs of uninterrupted sleep to help with mood and fatigue  5. Physical Recovery  - Discussed patients delivery and complications. She describes her labor as good. - Patient had a Vaginal, no problems at delivery.  - Patient has urinary incontinence?  No. - Patient is safe to resume physical and sexual activity  6.  Health Maintenance - HM due items addressed Yes - Last pap smear  Diagnosis  Date Value Ref Range Status  08/21/2022   Final   - Negative for intraepithelial lesion or malignancy (NILM)   Pap smear not done at today's visit.  -Breast Cancer screening indicated? No.   7. Chronic Disease/Pregnancy Condition follow up:  - PCP follow up (outlined above)  Lennart Pall, MD Center for [employer], [facility]

## 2023-04-16 NOTE — Patient Instructions (Addendum)
Congratulations! Thank you for letting us be part of your family's care. You will see your results in the MyChart app in 3-5 days. Please keep taking your blood pressure medicine and follow up with a primary care doctor.   AREA FAMILY PRACTICE PHYSICIANS  [facility] [phone]   [place]outheast [place] ([phone]) [facility] [address] [place], [place] [address] [phone] Mon-Fri 8:30-12:30, 1:30-5:00 Accepting [facility] Family Medicine at [facility] [address], [place], [place] [address] [phone] Mon-Fri 8:00-5:30 [facility] [address], [place], [place] [address] [phone] [place], Thur, Fri 8:30-5:00, Wed 10:00-7:00 (closed 1-2pm) Accepting [facility] [address], [place], [place]  [address] Phone - [phone]   Fax - [phone]  [place] [phone]) [facility] [address], [place], [place] [address] [phone] Mon-Fri 8:00-5:00 [facility] - Pediatrics at [facility])  [address] Sherian Maroon [place], [place] [address] [phone] Mon-Fri 8:30-5:30, Sat (Oct.-Mar.) 9:00-1:00 Accepting Medicaid  [place] [phone]) [facility] at [facility] [address], [place], [place] [address] [phone] Mon-Fri 8:00-5:00  [place] [phone]) [facility] Medicine at [facility] [address], [place], [place] [address] [phone] Mon-Fri 8:00-5:00 [facility] [address] [place], [place], [place] [address] [phone] Mon-Fri 8:00-5:00 [facility] [address] [place], [place] [address] [phone] Mon-Fri 8:00-5:00 [facility] [address] [place] [address] [phone] Mon-Fri 7:30-5:30  [place] [phone] & [phone]) [facility] [address] [place], [place] [address] [phone] Mon-Thur 8:00-6:00 Accepting [facility] [address] [place], [place] [address] [phone] Mon-Thur 7:30-7:30, Fri 7:30-4:30 Accepting [facility] Family Medicine at [facility] [address] [place], [place]  [address] [phone]   Fax - [phone]  [place] [phone] & [phone]) Adult nurse HealthCare at [facility] [address] [place], [place] [address] [phone] Mon-Fri 7:00-5:00 [facility] [address], [place], [place] [address] [phone] Mon-Fri 8:00-5:00 Accepting Medicaid [facility] - [facility] [address] [place] [address] [place], [place], [place] [address] [phone] Mon-Fri 8:00-5:00 Accepting Medicaid  [place] [phone]) [facility] Primary Care at [facility] [address] [place], [place] [address] [phone] Mon-Fri 8:00-5:00 [facility] - [facility] Family Medicine at [facility]) [address], [place], [place] [address] [phone] Mon-Fri 8:00-5:00 Accepting Medicaid [facility] - [facility] ([facility] [employer]) [address], [place], [place] [address] [phone] Mon-Fri 8:00-5:30, Sat&Sun by appointment (phones open at 8:30) Accepting [facility] [phone] & [phone]) [facility] [address], [place], [place] [address] [phone] Mon-Thur 8:00-7:00, Fri 8:00-5:00, Sat 8:00-12:00, Sun 9:00-12:00 Accepting Medicaid [facility] - Family Medicine at [facility] [address] [place], [place] [address] [phone] Mon-Thur 8:00-5:00 Accepting Medicaid [facility] Family Medicine at [place] [address] Sherian Maroon [place], [place] [address] [phone] Mon-Fri 8:00-5:30,  Sat (Oct.-Mar.) 9:00-1:00 Accepting [facility]  [place] [phone]) [facility] Medicine [address] [place], [place] [address] [phone] Mon-Fri 8:00-5:00 Accepting [facility]   [place] [phone]) [place] [facility] at [facility] [address], [place], [place] [address] [phone] Mon-Fri 8:00-5:00  HealthCare at [facility] [address], [place], [place] [address] [phone] Mon-Fri  8:00-5:00 [facility] [address] Pediatrics - [facility] [address], [place], [place] [address] [phone] Mon-Fri 8:00-5:00 After hours clinic [facility] [place], [place] [address]) [phone] Mon-Fri 5:00-8:00, Sat 12:00-6:00, Sun 10:00-4:00 Accepting Medicaid [facility] at [facility]. [address] [place], [place]  [address] [phone]   Fax - [phone]  [place] [phone]) Adult nurse HealthCare at [facility] [address] [place], [place], [place] [address] [phone] Mon-Fri 8:00-5:00 [facility] Family Medicine - [place] [facility] [place]) [address] [place] [address], [place], [place] [address] [phone] Mon-Thur 8:00-7:00, Fri 8:00-5:00, Sat 8:00-12:00

## 2023-04-16 NOTE — Addendum Note (Signed)
Addended by: Marya Landry D on: 04/16/2023 09:29 AM   Modules accepted: Orders

## 2023-04-17 LAB — GLUCOSE TOLERANCE, 2 HOURS
Glucose, 2 hour: 130 mg/dL (ref 70–139)
Glucose, GTT - Fasting: 82 mg/dL (ref 70–99)

## 2023-10-07 ENCOUNTER — Ambulatory Visit (INDEPENDENT_AMBULATORY_CARE_PROVIDER_SITE_OTHER)

## 2023-10-07 VITALS — BP 123/81 | HR 73 | Wt 139.0 lb

## 2023-10-07 DIAGNOSIS — Z3201 Encounter for pregnancy test, result positive: Secondary | ICD-10-CM | POA: Diagnosis not present

## 2023-10-07 DIAGNOSIS — Z3200 Encounter for pregnancy test, result unknown: Secondary | ICD-10-CM

## 2023-10-07 LAB — POCT URINE PREGNANCY: Preg Test, Ur: POSITIVE — AB

## 2023-10-07 MED ORDER — LABETALOL HCL 100 MG PO TABS: 100.0000 mg | ORAL_TABLET | Freq: Two times a day (BID) | ORAL | 0 refills | Status: DC

## 2023-10-07 NOTE — Progress Notes (Signed)
..  Sierra Gamble presents today for UPT. Pt states that she was started on Nifedipine at her postpartum visit in October 2024 with a 3 month supply. Pt states that she was suppose to follow up with PCP but never did. LMP:08/24/23    OBJECTIVE: Appears well, in no apparent distress. BP: 142/87 Pulse:91 ---Retake:123/81 Pulse:73 OB History     Gravida  6   Para  4   Term  3   Preterm  1   AB  1   Living  4      SAB  1   IAB      Ectopic      Multiple  0   Live Births  4          Home UPT Result:Positive In-Office UPT result:Positive I have reviewed the patient's medical, obstetrical, social, and family histories, and medications.   ASSESSMENT: Positive pregnancy test  PLAN Prenatal care to be completed at: [facility] with Dr. Shea Evans in office and advised pt that Labetalol will be sent to pharmacy, and BP check appt in 1 week. Advised to monitor BP at home and if abnormal symptoms occur, notify us or report to mau.  Provided safe med list

## 2023-10-14 ENCOUNTER — Ambulatory Visit

## 2023-10-21 ENCOUNTER — Ambulatory Visit (INDEPENDENT_AMBULATORY_CARE_PROVIDER_SITE_OTHER)

## 2023-10-21 VITALS — BP 113/73 | HR 51 | Wt 140.7 lb

## 2023-10-21 DIAGNOSIS — Z0130 Encounter for examination of blood pressure without abnormal findings: Secondary | ICD-10-CM

## 2023-10-21 NOTE — Progress Notes (Signed)
..  Subjective:  Sierra Gamble is a 38 y.o. pregnant female here for follow up BP check after starting labetalol  from 10/07/23 visit .   Hypertension ROS: taking medications as instructed, no medication side effects noted, no TIA's, no chest pain on exertion, no dyspnea on exertion, and no swelling of ankles.    Objective:  BP 113/73   Pulse (!) 51   Wt 140 lb 11.2 oz (63.8 kg)   LMP 08/24/2023   BMI 27.48 kg/m   Appearance alert, well appearing, and in no distress. General exam BP noted to be well controlled today in office.    Assessment:   Blood Pressure well controlled.   Plan:  Current treatment plan is effective, no change in therapy. Advised patient of abnormal signs/symptoms to report, pt voiced understanding. NOB intake appt scheduled for 11/04/23

## 2023-10-30 ENCOUNTER — Other Ambulatory Visit: Payer: Self-pay | Admitting: Obstetrics and Gynecology

## 2023-11-03 ENCOUNTER — Other Ambulatory Visit: Payer: Self-pay | Admitting: *Deleted

## 2023-11-04 ENCOUNTER — Other Ambulatory Visit

## 2023-11-04 ENCOUNTER — Ambulatory Visit: Admitting: *Deleted

## 2023-11-04 VITALS — BP 114/74 | HR 56 | Wt 136.9 lb

## 2023-11-04 DIAGNOSIS — Z3A01 Less than 8 weeks gestation of pregnancy: Secondary | ICD-10-CM

## 2023-11-04 DIAGNOSIS — O3680X1 Pregnancy with inconclusive fetal viability, fetus 1: Secondary | ICD-10-CM

## 2023-11-04 DIAGNOSIS — O3680X0 Pregnancy with inconclusive fetal viability, not applicable or unspecified: Secondary | ICD-10-CM

## 2023-11-04 DIAGNOSIS — O0990 Supervision of high risk pregnancy, unspecified, unspecified trimester: Secondary | ICD-10-CM

## 2023-11-04 DIAGNOSIS — Z1339 Encounter for screening examination for other mental health and behavioral disorders: Secondary | ICD-10-CM | POA: Diagnosis not present

## 2023-11-04 MED ORDER — BLOOD PRESSURE KIT DEVI: 1.0000 | 0 refills | Status: AC

## 2023-11-04 NOTE — Progress Notes (Signed)
 New OB Intake  I connected with Sierra Gamble  on 11/04/23 at  3:10 PM EDT by In Person Visit and verified that I am speaking with the correct person using two identifiers. Nurse is located at [facility] and pt is located at [place].  I discussed the limitations, risks, security and privacy concerns of performing an evaluation and management service by telephone and the availability of in person appointments. I also discussed with the patient that there may be a patient responsible charge related to this service. The patient expressed understanding and agreed to proceed.  I explained I am completing New OB Intake today. We discussed EDD of 05/30/2024, by Last Menstrual Period. Pt is [id number]. I reviewed her allergies, medications and Medical/Surgical/OB history.    Patient Active Problem List   Diagnosis Date Noted   Supervision of high risk pregnancy, antepartum 11/04/2023   History of preterm delivery 01/04/2019     Concerns addressed today  Delivery Plans Plans to [facility] at [facility]. Discussed the nature of our practice with multiple providers including residents and students. Due to the size of the practice, the delivering provider may not be the same as those providing prenatal care.   Patient is not interested in water birth.  MyChart/Babyscripts MyChart access verified. I explained pt will have some visits in office and some virtually. Babyscripts instructions given and order placed. Patient verifies receipt of registration text/e-mail. Account successfully created and app downloaded. If patient is a candidate for Optimized scheduling, add to sticky note.   Blood Pressure Cuff/Weight Scale Has BP cuff at home. Explained after first prenatal appt pt will check weekly and document in Babyscripts. Patient does not have weight scale; patient may purchase if they desire to track weight weekly in Babyscripts.  Anatomy US  Explained first scheduled US  will be around 19 weeks. Anatomy US   scheduled for NA at NA/ viability not confirmed.   Is patient a candidate for Babyscripts Optimization? No, due to NA/ viability not confirmed   First visit review I reviewed new OB appt with patient. Explained pt will be seen by NA/ viability not confirmed at first visit. Discussed [place] genetic screening with patient. NA [place].. Routine prenatal labs not collected today.   Last Pap Diagnosis  Date Value Ref Range Status  08/21/2022   Final   - Negative for intraepithelial lesion or malignancy (NILM)    Donette Furlong, RN 11/04/2023  3:49 PM

## 2023-11-04 NOTE — Progress Notes (Signed)
 Intrauterine GS and possible FP seen on 11/04/23 US . Transvaginal probe not available due to malfunction. Outpatient US  for viability and dating with TV US  ordered.

## 2023-11-05 ENCOUNTER — Ambulatory Visit (HOSPITAL_COMMUNITY)
Admission: RE | Admit: 2023-11-05 | Discharge: 2023-11-05 | Disposition: A | Discharge status: Home / Self Care | Source: Ambulatory Visit | Attending: Obstetrics & Gynecology | Admitting: Obstetrics & Gynecology

## 2023-11-05 DIAGNOSIS — O0990 Supervision of high risk pregnancy, unspecified, unspecified trimester: Secondary | ICD-10-CM | POA: Diagnosis present

## 2023-11-13 ENCOUNTER — Telehealth: Payer: Self-pay | Admitting: Obstetrics and Gynecology

## 2023-11-13 DIAGNOSIS — O039 Complete or unspecified spontaneous abortion without complication: Secondary | ICD-10-CM

## 2023-11-13 NOTE — Telephone Encounter (Signed)
 Reviewed US  and report from 5/9.   - Discussed options: expectant management, misoprostol  and D&E. Discussed the risks and benefits to each.   - We discussed dosing and process of miso: Discussed normal bleeding and cramping with the medication. Discussed pain medication often times needed when medically induced for missed abortion. Discussed success rate is depending on gestational age at the time of miscarriage  - Risks of surgery include but are not limited to: bleeding, infection, injury to surrounding organs/tissues (i.e. bowel/bladder/ureters), need for additional procedures, wound complications, hospital re-admission, and conversion to open surgery - We discussed postop restrictions, precautions and expectations. We discussed typical hospital course and stay.  - She is RH positive - She would like: Expectant management  We discussed she may do expectant management for about 2 weeks, and if nothing has happened by that point, I would recommend proceeding with one of the other two options. Will have office follow up with her to make an appt in 2 weeks.   Lacey Pian, MD Attending Obstetrician & Gynecologist, [facility] for [facility], [facility]

## 2023-11-26 ENCOUNTER — Encounter: Payer: Self-pay | Admitting: Obstetrics & Gynecology

## 2023-11-26 ENCOUNTER — Inpatient Hospital Stay (HOSPITAL_COMMUNITY)

## 2023-11-26 ENCOUNTER — Other Ambulatory Visit: Payer: Self-pay | Admitting: Obstetrics and Gynecology

## 2023-11-26 ENCOUNTER — Inpatient Hospital Stay (HOSPITAL_COMMUNITY)
Admission: AD | Admit: 2023-11-26 | Discharge: 2023-11-26 | Disposition: A | Discharge status: Home / Self Care | Attending: Obstetrics and Gynecology | Admitting: Obstetrics and Gynecology

## 2023-11-26 DIAGNOSIS — O034 Incomplete spontaneous abortion without complication: Secondary | ICD-10-CM | POA: Diagnosis not present

## 2023-11-26 DIAGNOSIS — O209 Hemorrhage in early pregnancy, unspecified: Secondary | ICD-10-CM | POA: Diagnosis present

## 2023-11-26 DIAGNOSIS — Z8616 Personal history of COVID-19: Secondary | ICD-10-CM | POA: Insufficient documentation

## 2023-11-26 LAB — COMPREHENSIVE METABOLIC PANEL WITH GFR
ALT: 13 U/L (ref 0–44)
AST: 19 U/L (ref 15–41)
Albumin: 3.7 g/dL (ref 3.5–5.0)
Alkaline Phosphatase: 43 U/L (ref 38–126)
Anion gap: 7 (ref 5–15)
BUN: 9 mg/dL (ref 6–20)
CO2: 24 mmol/L (ref 22–32)
Calcium: 8.8 mg/dL — ABNORMAL LOW (ref 8.9–10.3)
Chloride: 105 mmol/L (ref 98–111)
Creatinine, Ser: 0.65 mg/dL (ref 0.44–1.00)
GFR, Estimated: >60 mL/min (ref >60)
Glucose, Bld: 79 mg/dL (ref 70–99)
Potassium: 3.8 mmol/L (ref 3.5–5.1)
Sodium: 136 mmol/L (ref 135–145)
Total Bilirubin: 0.6 mg/dL (ref 0.0–1.2)
Total Protein: 7.4 g/dL (ref 6.5–8.1)

## 2023-11-26 LAB — ABO/RH: ABO/RH(D): O POS

## 2023-11-26 LAB — CBC
HCT: 32.4 % — ABNORMAL LOW (ref 36.0–46.0)
Hemoglobin: 10.6 g/dL — ABNORMAL LOW (ref 12.0–15.0)
MCH: 24.1 pg — ABNORMAL LOW (ref 26.0–34.0)
MCHC: 32.7 g/dL (ref 30.0–36.0)
MCV: 73.6 fL — ABNORMAL LOW (ref 80.0–100.0)
Platelets: 340 10*3/uL (ref 150–400)
RBC: 4.4 MIL/uL (ref 3.87–5.11)
RDW: 14.7 % (ref 11.5–15.5)
WBC: 6.8 10*3/uL (ref 4.0–10.5)
nRBC: 0 % (ref 0.0–0.2)

## 2023-11-26 LAB — HCG, QUANTITATIVE, PREGNANCY: hCG, Beta Chain, Quant, S: 5417 m[IU]/mL — ABNORMAL HIGH (ref <5)

## 2023-11-26 MED ORDER — FERROUS SULFATE 325 (65 FE) MG PO TBEC: 325.0000 mg | DELAYED_RELEASE_TABLET | Freq: Two times a day (BID) | ORAL | 3 refills | Status: DC

## 2023-11-26 MED ORDER — VITAFOL GUMMIES 3.33-0.333-34.8 MG PO CHEW: 1.0000 | CHEWABLE_TABLET | Freq: Every day | ORAL | 5 refills | Status: DC

## 2023-11-26 MED ORDER — ACETAMINOPHEN 500 MG PO TABS
1000.0000 mg | ORAL_TABLET | Freq: Once | ORAL | Status: AC
  Administered 2023-11-26: 1000 mg via ORAL
  Filled 2023-11-26: qty 2

## 2023-11-26 MED ORDER — IBUPROFEN 600 MG PO TABS
600.0000 mg | ORAL_TABLET | Freq: Four times a day (QID) | ORAL | Status: DC | PRN
  Administered 2023-11-26: 600 mg via ORAL
  Filled 2023-11-26: qty 1

## 2023-11-26 MED ORDER — CYCLOBENZAPRINE HCL 5 MG PO TABS
5.0000 mg | ORAL_TABLET | Freq: Three times a day (TID) | ORAL | Status: DC | PRN
  Administered 2023-11-26: 5 mg via ORAL
  Filled 2023-11-26: qty 1
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
## 2023-11-26 NOTE — MAU Note (Signed)
 Sierra Gamble is a 38 y.o. at [redacted]w[redacted]d here in MAU reporting: was told she had a failed pregnancy at 10 weeks. Opted for expected management. Started having spotting on Monday. And more bleeding yesterday. Not sure if everything had passed.  Reports some moderate bleeding not changing pad. Reports pain in her neck and back  and lower abd. Has not taken anything for the pain.  wanting to check to see if everything has passed.   LMP:  Onset of complaint: Monday Pain score: 7 Vitals:   11/26/23 1141  BP: 136/88  Pulse: (!) 56  Resp: 18  Temp: 98.1 F (36.7 C)     FHT: n/a  Lab orders placed from triage:

## 2023-12-02 ENCOUNTER — Encounter: Payer: Self-pay | Admitting: Obstetrics & Gynecology

## 2023-12-02 ENCOUNTER — Ambulatory Visit: Admitting: Obstetrics & Gynecology

## 2023-12-02 VITALS — BP 130/83 | HR 57 | Wt 137.0 lb

## 2023-12-02 DIAGNOSIS — Z3A14 14 weeks gestation of pregnancy: Secondary | ICD-10-CM

## 2023-12-02 DIAGNOSIS — O034 Incomplete spontaneous abortion without complication: Secondary | ICD-10-CM

## 2023-12-02 MED ORDER — MISOPROSTOL 200 MCG PO TABS: ORAL_TABLET | ORAL | 1 refills | Status: DC
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
## 2023-12-02 NOTE — Progress Notes (Signed)
 Pt is in office for f/u [facility], pt is still having some light bleeding.  Pt state she would like to try for another pregnancy.

## 2023-12-16 ENCOUNTER — Ambulatory Visit: Admitting: Obstetrics

## 2023-12-16 VITALS — BP 172/99 | HR 53 | Ht 60.0 in | Wt 135.0 lb

## 2023-12-16 DIAGNOSIS — O021 Missed abortion: Secondary | ICD-10-CM

## 2023-12-16 MED ORDER — MISOPROSTOL 200 MCG PO TABS: ORAL_TABLET | ORAL | 1 refills | Status: DC

## 2023-12-16 NOTE — Progress Notes (Signed)
 Patient ID: Sierra Gamble, female   DOB: Mar 01, 1986, 38 y.o.   MRN: [id number]  No chief complaint on file.   HPI Sierra Gamble is a 38 y.o. female. 6 week  IUFD diagnosed on ultrasound on 11/01/2023, and she was given Cytotec .  She had no response to the Cytotec .  She presents today for follow up.  Denies any cramping or bleeding.since placing the Cytotec . 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   COVID-19 06/17/2019   Gestational diabetes    Maternal varicella, non-immune 12/30/2017   Poor fetal growth affecting management of mother in third trimester 04/06/2019   Postpartum hypertension 01/20/2023    Past Surgical History:  Procedure Laterality Date   NO PAST SURGERIES      Family History  Problem Relation Age of Onset   Thyroid disease Mother    Stroke Mother    Hyperlipidemia Mother    COPD Father    Diabetes Maternal Grandmother    Thyroid disease Maternal Grandmother     Social History Social History   Tobacco Use   Smoking status: Never   Smokeless tobacco: Never  Vaping Use   Vaping status: Never Used  Substance Use Topics   Alcohol use: No   Drug use: No    No Known Allergies  Current Outpatient Medications  Medication Sig Dispense Refill   ferrous sulfate  325 (65 FE) MG EC tablet Take 1 tablet (325 mg total) by mouth 2 (two) times daily. 60 tablet 3   labetalol  (NORMODYNE ) 100 MG tablet Take 1 tablet (100 mg total) by mouth 2 (two) times daily. 60 tablet 0   Prenatal Vit-Fe Phos-FA-Omega (VITAFOL  GUMMIES) 3.33-0.333-34.8 MG CHEW CHEW 1 TABLET BY MOUTH EVERY DAY 90 tablet 5   Blood Pressure Monitoring (BLOOD PRESSURE KIT) DEVI 1 Device by Does not apply route once a week. (Patient not taking: Reported on 12/16/2023) 1 each 0   misoprostol  (CYTOTEC ) 200 MCG tablet Place two tablets in between your gums and cheeks (one tablets on each side) as instructed and insert two tablets vaginally (Patient not  taking: Reported on 12/16/2023) 4 tablet 1   NIFEdipine  (ADALAT  CC) 30 MG 24 hr tablet Take 1 tablet (30 mg total) by mouth daily. (Patient not taking: Reported on 10/07/2023) 90 tablet 3   Prenatal MV & Min w/FA-DHA (PRENATAL GUMMIES PO) Take 2 tablets by mouth daily. (Patient not taking: Reported on 12/16/2023)     No current facility-administered medications for this visit.    Review of Systems Review of Systems Constitutional: negative for fatigue and weight loss Respiratory: negative for cough and wheezing Cardiovascular: negative for chest pain, fatigue and palpitations Gastrointestinal: negative for abdominal pain and change in bowel habits Genitourinary: positive for IUFD Integument/breast: negative for nipple discharge Musculoskeletal:negative for myalgias Neurological: negative for gait problems and tremors Behavioral/Psych: negative for abusive relationship, depression Endocrine: negative for temperature intolerance      Blood pressure (!) 167/94, pulse (!) 53, height 5' (1.524 m), weight 135 lb (61.2 kg), unknown if currently breastfeeding.  Physical Exam Physical Exam General:   Alert and no distress  Skin:   no rash or abnormalities  Lungs:   clear to auscultation bilaterally  Heart:   regular rate and rhythm, S1, S2 normal, no murmur, click, rub or gallop  Breasts:   normal without suspicious masses, skin or nipple changes or axillary nodes  Abdomen:  normal findings: no organomegaly, soft,  non-tender and no hernia  Pelvis:  External genitalia: normal general appearance Urinary system: urethral meatus normal and bladder without fullness, nontender Vaginal: normal without tenderness, induration or masses Cervix: normal appearance Adnexa: normal bimanual exam Uterus: anteverted and non-tender, slightly enlarged      Data Reviewed  ULTRASOUND:   US  OB Limited (Accession [id number]) (Order [id number]) Imaging Date: 11/04/2023 Department: [facility]  IMAGING Released By: Donette Furlong, RN (auto-released) Authorizing: Tresia Fruit, MD   Exam Status  Status  Final [99]   PACS Intelerad Image Link   Show images for US  OB Limited Study Result  Narrative & Impression    ----------------------------------------------------------------------  OBSTETRICS REPORT                       (Signed Final 12/09/2023 02:35 pm) ---------------------------------------------------------------------- Patient Info    ID #:       [id number]                          D.O.B.:  January 31, 1986 (38 yrs)(F)  Name:       Sierra Gamble                      Visit Date: 11/04/2023 05:01 pm ---------------------------------------------------------------------- Performed By    Attending:        Onnie Bilis MD        Ref. Address:     [address]                                                             [place], [place]                                                             [address]  Performed By:     Hortensia Ma RN        [facility]         [facility]                                                             [employer] [facility]  [place]  Referred By:      [facility] ---------------------------------------------------------------------- Orders    #  Description                           Code        Ordered By  1  US  OB LIMITED                         09323.5     Onnie Bilis ----------------------------------------------------------------------    #  Order #                     Accession #                Episode #  1  [id number]                   [id number]                 [id number] ---------------------------------------------------------------------- Indications    Weeks of gestation of pregnancy not            Z3A.00   specified ---------------------------------------------------------------------- Fetal Evaluation    Num Of Fetuses:         1  Preg. Location:         Intrauterine  Gest. Sac:              Intrauterine  Yolk Sac:               Not visualized  Fetal Pole:             Visualized  Cardiac Activity:       Not visualized ---------------------------------------------------------------------- Biometry    GS:       16.8  mm     G. Age:  6w 5d                   EDD:   06/24/24  CRL:       6.9  mm     G. Age:  6w 4d                   EDD:   06/25/24 ---------------------------------------------------------------------- Comments    Intrauterine GS visualized. Possible FP visualized. YS not  visualized. Transvaginal US  not available due to malfunction.  Discussed with Dr. Willey Harrier. Plan for formal US  outpatient for  viability and dating scan. ---------------------------------------------------------------------- Impression    IUP with possible fetal pole, unable to perform vaginal US  ---------------------------------------------------------------------- Recommendations   Plan for formal US  outpatient for viability and dating scan ----------------------------------------------------------------------                 Onnie Bilis, MD Electronically Signed Final Report   12/09/2023 02:35 pm ----------------------------------------------------------------------       Assessment     1. IUFD at less than 20 weeks of gestation (Primary) - repeat Cytotec  800 MCG Rx: - misoprostol  (CYTOTEC ) 200 MCG tablet; Place two tablets in between your gums and cheeks (one tablets on each side) as instructed and insert two tablets vaginally  Dispense: 4 tablet; Refill: 1     Plan   FOLLOW UP IN 2 WEEKS  Gabrielle Joiner, MD, FACOG Attending Obstetrician & Gynecologist, [facility] for [facility], [facility], [place] 12/16/2023

## 2023-12-16 NOTE — Progress Notes (Signed)
 Does not have BP monitor at home. Missed dose this AM. No bleeding at this time. Has not been heavy bleeding since given Cytotec . Not sure passed pregnancy.  Has been sexually active since Cytotec , but used condom. Repeat BP elevated more. Plans to take meds as soon as possible. Plans condom use.

## 2023-12-26 ENCOUNTER — Inpatient Hospital Stay (HOSPITAL_COMMUNITY)
Admission: AD | Admit: 2023-12-26 | Discharge: 2023-12-26 | Disposition: A | Discharge status: Home / Self Care | Attending: Obstetrics & Gynecology | Admitting: Obstetrics & Gynecology

## 2023-12-26 ENCOUNTER — Encounter (HOSPITAL_COMMUNITY): Payer: Self-pay | Admitting: *Deleted

## 2023-12-26 ENCOUNTER — Inpatient Hospital Stay (HOSPITAL_COMMUNITY)

## 2023-12-26 DIAGNOSIS — Z3A01 Less than 8 weeks gestation of pregnancy: Secondary | ICD-10-CM | POA: Diagnosis not present

## 2023-12-26 DIAGNOSIS — O039 Complete or unspecified spontaneous abortion without complication: Secondary | ICD-10-CM

## 2023-12-26 DIAGNOSIS — O021 Missed abortion: Secondary | ICD-10-CM

## 2023-12-26 LAB — COMPREHENSIVE METABOLIC PANEL WITH GFR
ALT: 14 U/L (ref 0–44)
AST: 22 U/L (ref 15–41)
Albumin: 3.8 g/dL (ref 3.5–5.0)
Alkaline Phosphatase: 49 U/L (ref 38–126)
Anion gap: 10 (ref 5–15)
BUN: 12 mg/dL (ref 6–20)
CO2: 24 mmol/L (ref 22–32)
Calcium: 9.3 mg/dL (ref 8.9–10.3)
Chloride: 106 mmol/L (ref 98–111)
Creatinine, Ser: 0.69 mg/dL (ref 0.44–1.00)
GFR, Estimated: >60 mL/min (ref >60)
Glucose, Bld: 93 mg/dL (ref 70–99)
Potassium: 4.8 mmol/L (ref 3.5–5.1)
Sodium: 140 mmol/L (ref 135–145)
Total Bilirubin: 0.9 mg/dL (ref 0.0–1.2)
Total Protein: 7.7 g/dL (ref 6.5–8.1)

## 2023-12-26 LAB — HCG, QUANTITATIVE, PREGNANCY: hCG, Beta Chain, Quant, S: 3 m[IU]/mL (ref <5)

## 2023-12-26 LAB — CBC
HCT: 34.1 % — ABNORMAL LOW (ref 36.0–46.0)
Hemoglobin: 10.9 g/dL — ABNORMAL LOW (ref 12.0–15.0)
MCH: 24.2 pg — ABNORMAL LOW (ref 26.0–34.0)
MCHC: 32 g/dL (ref 30.0–36.0)
MCV: 75.6 fL — ABNORMAL LOW (ref 80.0–100.0)
Platelets: 352 10*3/uL (ref 150–400)
RBC: 4.51 MIL/uL (ref 3.87–5.11)
RDW: 15.1 % (ref 11.5–15.5)
WBC: 6.9 10*3/uL (ref 4.0–10.5)
nRBC: 0 % (ref 0.0–0.2)

## 2023-12-26 LAB — ABO/RH: ABO/RH(D): O POS

## 2023-12-26 MED ORDER — ACETAMINOPHEN-CODEINE 300-30 MG PO TABS: 1.0000 | ORAL_TABLET | Freq: Four times a day (QID) | ORAL | 0 refills | Status: DC | PRN

## 2023-12-26 MED ORDER — NIFEDIPINE 10 MG PO CAPS
10.0000 mg | ORAL_CAPSULE | Freq: Once | ORAL | Status: AC
  Administered 2023-12-26: 10 mg via ORAL
  Filled 2023-12-26: qty 1

## 2023-12-26 MED ORDER — MISOPROSTOL 200 MCG PO TABS: ORAL_TABLET | ORAL | Status: DC

## 2023-12-26 MED ORDER — MISOPROSTOL 200 MCG PO TABS: ORAL_TABLET | ORAL | 2 refills | Status: DC

## 2023-12-26 MED ORDER — PROMETHAZINE HCL 12.5 MG PO TABS: 12.5000 mg | ORAL_TABLET | Freq: Four times a day (QID) | ORAL | 0 refills | Status: DC | PRN

## 2023-12-26 MED ORDER — LABETALOL HCL 100 MG PO TABS: 200.0000 mg | ORAL_TABLET | Freq: Two times a day (BID) | ORAL | 0 refills | Status: DC

## 2023-12-26 NOTE — Discharge Instructions (Addendum)
 Ms Kwan,  I'm so sorry for your loss. I am sending medication to help your body pass the rest of the pregnancy tissue as well as to manage any side effects of the medication.   I recommend good nutrition for both you and your partner before trying to conceive again.   Thank you for trusting us  to care for you, Camie, Midwife

## 2023-12-26 NOTE — MAU Note (Signed)
 Marabeth Rude is a 38 y.o. at Unknown here in MAU reporting: took pill Fri June 6, had mild cramping. Bleeding stopped on Sunday, never passed clots, f/u was on 6/19. cx was still closed. Provider felt like preg may have dropped, but not passed.  Was advised to take the pill again.  No bleeding in past week.  Doesn't feel comfortable repeating pills unless sure she has not passed the preg. Denies pain.  Onset of complaint: ongoing surveliance Pain score: none Vitals:   12/26/23 1104  BP: (!) 173/105  Pulse: (!) 55  Resp: 16  Temp: 98.5 F (36.9 C)  SpO2: 100%     Lab orders placed from triage:  blood work has been drawn    Did take BP med this morning

## 2023-12-29 ENCOUNTER — Ambulatory Visit: Admitting: Obstetrics & Gynecology

## 2023-12-29 ENCOUNTER — Encounter: Payer: Self-pay | Admitting: Obstetrics & Gynecology

## 2023-12-29 VITALS — BP 129/86 | HR 54 | Ht 60.0 in | Wt 136.0 lb

## 2023-12-29 DIAGNOSIS — O039 Complete or unspecified spontaneous abortion without complication: Secondary | ICD-10-CM

## 2023-12-29 DIAGNOSIS — Z3A01 Less than 8 weeks gestation of pregnancy: Secondary | ICD-10-CM | POA: Diagnosis not present

## 2023-12-29 NOTE — Progress Notes (Signed)
 38 y.o. GYN presents for Follow Up and US  results.  Pt started spotting today, nausea and fatigue.  Denies pain, fever, chills.

## 2023-12-29 NOTE — Progress Notes (Signed)
 Patient ID: Sierra Gamble, female   DOB: [birth year], 38 y.o.   MRN: [id number] Ultrasounds Results Note  SUBJECTIVE HPI:  Sierra Gamble is a 38 y.o. H2E6885 at Unknown by LMP who presents to [facility] for followup ultrasound results. The patient denies abdominal pain: notes small amount of vaginal bleeding.  .  Last seen in MAU on 6/29. BHCG was 3.  Repeat ultrasound was performed earlier 6/29.   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   COVID-19 06/17/2019   Gestational diabetes    Maternal varicella, non-immune 12/30/2017   Poor fetal growth affecting management of mother in third trimester 04/06/2019   Postpartum hypertension 01/20/2023   Past Surgical History:  Procedure Laterality Date   NO PAST SURGERIES     Social History   Socioeconomic History   Marital status: Single    Spouse name: Not on file   Number of children: Not on file   Years of education: Not on file   Highest education level: Not on file  Occupational History   Not on file  Tobacco Use   Smoking status: Never   Smokeless tobacco: Never  Vaping Use   Vaping status: Never Used  Substance and Sexual Activity   Alcohol use: No   Drug use: No   Sexual activity: Yes    Partners: Male    Birth control/protection: None  Other Topics Concern   Not on file  Social History Narrative   Not on file   Social Drivers of Health   Financial Resource Strain: Medium Risk (08/16/2023)   Received from [facility]   Financial Resource Strain    How hard is it for you to pay for the very basics like food, housing, medical care, and heating?: Somewhat hard  Food Insecurity: Food Insecurity Present (08/16/2023)   Received from [facility]   Hunger Vital Sign    Within the past 12 months, you worried that your food would run out before you got the money to buy more.: Sometimes true    Within the past 12 months, the food you bought  just didn't last and you didn't have money to get more.: Never true  Transportation Needs: No Transportation Needs (08/16/2023)   Received from [facility]   PRAPARE - Transportation    Lack of Transportation (Medical): No    Lack of Transportation (Non-Medical): No  Physical Activity: Inactive (08/16/2023)   Received from [facility]   Exercise Vital Sign    On average, how many days per week do you engage in moderate to strenuous exercise (like a brisk walk)?: 0 days    On average, how many minutes do you engage in exercise at this level?: 0 min  Stress: No Stress Concern Present (08/16/2023)   Received from [facility]   [employer] of Occupational Health - Occupational Stress Questionnaire    Feeling of Stress : Only a little  Social Connections: Moderately Isolated (08/16/2023)   Received from [facility]   Social Connections    In a typical week, how many times do you talk on the phone with family, friends, or neighbors?: Once a week    How often do you get together with friends or relatives?: Once a week    How often do you attend church or religious services?: More than 4 times per year  Do you belong to any clubs or organizations such as church groups, unions, fraternal or athletic groups, or school groups?: No    How often do you attend meetings of the clubs or organizations you belong to?: Never    Are you married, widowed, divorced, separated, never married, or living with a partner?: Living with partner  Intimate Partner Violence: At Risk (08/16/2023)   Received from [facility]   Humiliation, Afraid, Rape, and Kick questionnaire    Within the last year, have you been afraid of your partner or ex-partner?: No    Within the last year, have you been humiliated or emotionally abused in other ways by your partner or ex-partner?: Yes    Within the last year, have you been kicked, hit, slapped, or otherwise  physically hurt by your partner or ex-partner?: No    Within the last year, have you been raped or forced to have any kind of sexual activity by your partner or ex-partner?: No   Current Outpatient Medications on File Prior to Visit  Medication Sig Dispense Refill   ferrous sulfate  325 (65 FE) MG EC tablet Take 1 tablet (325 mg total) by mouth 2 (two) times daily. 60 tablet 3   labetalol  (NORMODYNE ) 100 MG tablet Take 2 tablets (200 mg total) by mouth 2 (two) times daily. 60 tablet 0   Prenatal Vit-Fe Phos-FA-Omega (VITAFOL  GUMMIES) 3.33-0.333-34.8 MG CHEW CHEW 1 TABLET BY MOUTH EVERY DAY 90 tablet 5   promethazine (PHENERGAN) 12.5 MG tablet Take 1 tablet (12.5 mg total) by mouth every 6 (six) hours as needed for nausea or vomiting. 30 tablet 0   acetaminophen -codeine (TYLENOL  #3) 300-30 MG tablet Take 1-2 tablets by mouth every 6 (six) hours as needed for moderate pain (pain score 4-6). 15 tablet 0   Blood Pressure Monitoring (BLOOD PRESSURE KIT) DEVI 1 Device by Does not apply route once a week. (Patient not taking: Reported on 12/16/2023) 1 each 0   misoprostol  (CYTOTEC ) 200 MCG tablet Place two tablets in between your gums and cheeks (one tablets on each side) as instructed  every 8 hours for 3 days. 18 tablet 01   NIFEdipine  (ADALAT  CC) 30 MG 24 hr tablet Take 1 tablet (30 mg total) by mouth daily. (Patient not taking: Reported on 10/07/2023) 90 tablet 3   Prenatal MV & Min w/FA-DHA (PRENATAL GUMMIES PO) Take 2 tablets by mouth daily. (Patient not taking: Reported on 12/16/2023)     No current facility-administered medications on file prior to visit.   No Known Allergies  I have reviewed patient's Past Medical Hx, Surgical Hx, Family Hx, Social Hx, medications and allergies.   Review of Systems Review of Systems  Constitutional: Negative for fever and chills.  Gastrointestinal: Negative for nausea, vomiting, abdominal pain, diarrhea and constipation.  Genitourinary: Negative for dysuria.   Musculoskeletal: Negative for back pain.  Neurological: Negative for dizziness and weakness.    Physical Exam  BP 129/86 (BP Location: Right Arm, Cuff Size: Small)   Pulse (!) 54   Ht 5' (1.524 m)   Wt 136 lb (61.7 kg)   LMP 08/24/2023 (Approximate)   BMI 26.56 kg/m   GENERAL: Well-developed, well-nourished female in no acute distress.  HEENT: Normocephalic, atraumatic.   LUNGS: Effort normal ABDOMEN: soft, non-tender HEART: Regular rate  SKIN: Warm, dry and without erythema PSYCH: Normal mood and affect NEURO: Alert and oriented x 4  LAB RESULTS No results found for this or any previous visit (from the  past 24 hours).  IMAGING US  OB Transvaginal Result Date: 12/26/2023 CLINICAL DATA:  10256 Miscarriage 10256 EXAM: TRANSVAGINAL OB ULTRASOUND TECHNIQUE: Transvaginal ultrasound was performed for complete evaluation of the gestation as well as the maternal uterus, adnexal regions, and pelvic cul-de-sac. COMPARISON:  Nov 26, 2023 FINDINGS: Intrauterine gestational sac: Not present, at this time. Yolk sac:  Not present, at this time. Fetal Pole:  Not present, at this time. Subchorionic hemorrhage:  None visualized. Maternal uterus/adnexae: The endometrium is thickened measuring 17 mm thick with a small amount of fluid in the endometrial canal. There is a focal region of nodular echogenicity with vascular Doppler flow in the upper endometrium. Nonenlarged ovaries. No free pelvic fluid. IMPRESSION: The intrauterine gestation on the prior ultrasound is no longer present. Heterogeneous thickening of the upper endometrium measuring 17 mm thick with a focal region of nodular thickening with vascular Doppler flow, which may represent infolding of the endometrium related to the recent miscarriage; however, a small focus of retained products of conception could also have this appearance. Laboratory correlation recommended. These results will be called to the ordering clinician or representative by the  Radiologist Assistant and communication documented in the PACS or [employer]. Electronically Signed   By: Rogelia Myers M.D.   On: 12/26/2023 12:55    ASSESSMENT C/w complete miscarriage with HCG 3 on 6/29 and US  without definitive evidence of POC  PLAN Discharge home in stable condition. Will use condoms for now but will want to conceive after short time  Eveline Lynwood MATSU, MD  12/29/2023  2:39 PM

## 2024-01-02 ENCOUNTER — Other Ambulatory Visit: Payer: Self-pay | Admitting: Certified Nurse Midwife

## 2024-05-05 ENCOUNTER — Ambulatory Visit (HOSPITAL_BASED_OUTPATIENT_CLINIC_OR_DEPARTMENT_OTHER)

## 2024-05-05 ENCOUNTER — Encounter (HOSPITAL_BASED_OUTPATIENT_CLINIC_OR_DEPARTMENT_OTHER): Payer: Self-pay

## 2024-05-05 VITALS — BP 133/86 | HR 59 | Ht 60.0 in | Wt 135.8 lb

## 2024-05-05 DIAGNOSIS — O3680X0 Pregnancy with inconclusive fetal viability, not applicable or unspecified: Secondary | ICD-10-CM

## 2024-05-05 DIAGNOSIS — Z3201 Encounter for pregnancy test, result positive: Secondary | ICD-10-CM

## 2024-05-05 DIAGNOSIS — Z3200 Encounter for pregnancy test, result unknown: Secondary | ICD-10-CM

## 2024-05-05 DIAGNOSIS — Z3A01 Less than 8 weeks gestation of pregnancy: Secondary | ICD-10-CM

## 2024-05-05 LAB — POCT URINE PREGNANCY: Preg Test, Ur: POSITIVE — AB

## 2024-05-05 MED ORDER — PRENATAL 27-1 MG PO TABS: 1.0000 | ORAL_TABLET | Freq: Every day | ORAL | 11 refills | Status: AC

## 2024-05-05 NOTE — Progress Notes (Signed)
 NURSE VISIT- PREGNANCY CONFIRMATION   SUBJECTIVE:  Sierra Gamble is a 38 y.o. H2E6885 female at [redacted]w[redacted]d by certain LMP. Patient's last menstrual period was 03/20/2024 (exact date). Here for pregnancy confirmation.  Home pregnancy test: positive x 2  She reports no complaints.  She is not taking prenatal vitamins.    I explained I am completing New OB Intake today. We discussed EDD of 12/25/2024 based on LMP of 03/20/2024. Pt is H2E6885. I reviewed her allergies, medications and Medical/Surgical/OB history.    Patient Active Problem List   Diagnosis Date Noted   History of preterm delivery 01/04/2019     Concerns addressed today  MyChart/Babyscripts MyChart access verified. I explained pt will have some visits in office and some virtually. Babyscripts instructions given and order placed. Patient verifies receipt of registration text/e-mail. Account successfully created and app downloaded.   Blood Pressure Cuff/Weight Scale Patient has private insurance; instructed to purchase blood pressure cuff and bring to first prenatal appt. Explained after first prenatal appt pt will check weekly and document in Babyscripts. Patient does have weight scale.  Is patient a candidate for Babyscripts Optimization? Yes, patient accepted   Last Pap Diagnosis  Date Value Ref Range Status  08/21/2022   Final   - Negative for intraepithelial lesion or malignancy (NILM)    OBJECTIVE:  BP 133/86 (BP Location: Right Arm, Patient Position: Sitting, Cuff Size: Normal)   Pulse (!) 59   Ht 5' (1.524 m)   Wt 135 lb 12.8 oz (61.6 kg)   LMP 03/20/2024 (Exact Date)   SpO2 100%   BMI 26.52 kg/m   Appears well, in no apparent distress  Results for orders placed or performed in visit on 05/05/24 (from the past 24 hours)  POCT urine pregnancy   Collection Time: 05/05/24  8:58 AM  Result Value Ref Range   Preg Test, Ur Positive (A) Negative    ASSESSMENT: Positive pregnancy test. LMP 03/20/2024. EDD  12/25/2024. Patient has a history of gestation diabetes and postpartum hypertension.  PLAN: Prenatal vitamins: Prenatal 27-1 mg take 1 tablet daily #30 11RF sent to pharmacy on file.   Nausea medicines: not currently needed . New OB packet provided and reviewed with patient. Advised patient to bring completed forms back to her new OB appointment. Viability scan ordered to be completed downstairs as patient has scheduling restrictions. New OB appointment scheduled for 06/20/24 at 10:15 am with Arland Roller, CNM.

## 2024-05-06 ENCOUNTER — Encounter: Payer: Self-pay | Admitting: Obstetrics and Gynecology

## 2024-05-06 DIAGNOSIS — E039 Hypothyroidism, unspecified: Secondary | ICD-10-CM | POA: Insufficient documentation

## 2024-05-06 DIAGNOSIS — O10919 Unspecified pre-existing hypertension complicating pregnancy, unspecified trimester: Secondary | ICD-10-CM | POA: Insufficient documentation

## 2024-05-08 ENCOUNTER — Telehealth (HOSPITAL_BASED_OUTPATIENT_CLINIC_OR_DEPARTMENT_OTHER): Payer: Self-pay

## 2024-05-08 NOTE — Telephone Encounter (Signed)
 Called patient and spoke to her in reference to her blood pressure reading that was logged into baby scripts. Patient states she is having no symptoms. No headache, blurry vision or nausea/vomiting. Patient is currently taking labetalol  100mg  twice a day (one in the morning and one at night). However, the Rx reads to take Labetalol  100mg  2 tabs twice daily. Please advise. tbw

## 2024-05-09 NOTE — Telephone Encounter (Signed)
 Called patient in reference to her elevated blood pressure reading in baby scripts from Saturday 11/8. Per Cleotilde, patient was advised to increase medication to Labetalol  100mg  2 tablets twice a day. Patient is a little concerned about increasing medication because her blood pressure reading are not always high. Patient blood pressure readings have been as follows: 11/9  8:59am  121/80 11/10  8:12am  130/89 11/10  1:50pm  127/86 11/11  10:00am  125/84 I advised patient to continue what she is currently taking and I would speak with Dr. Cleotilde and get back with her.

## 2024-05-10 NOTE — Telephone Encounter (Signed)
 Blood pressures reviewed with CMA, Tonya Winchester.  Given only one elevated pressure, will continue to monitor. And stay on labetalol  100mg  bid.  Pt will continue to put readings in baby scripts. New OB scheduled 06/20/2024.  Viability scan needs scheduling so will route to nursing to get scheduled.

## 2024-05-10 NOTE — Telephone Encounter (Signed)
 Called patient to let her know to stay on what she is currently taking which is Labetalol  100mg  twice a day. We will continue to monitor blood pressure. Advised patient to continue to log readings into baby scripts. Because her ultrasound is being scheduled downstairs, we will call and find out when they can get her in for viability scan. tbw

## 2024-05-12 NOTE — Telephone Encounter (Signed)
 Spoke with Dorthea at [facility] who states she will contact the patient to schedule her viability scan.

## 2024-05-22 ENCOUNTER — Telehealth (HOSPITAL_BASED_OUTPATIENT_CLINIC_OR_DEPARTMENT_OTHER): Payer: Self-pay

## 2024-05-22 MED ORDER — PROMETHAZINE HCL 25 MG PO TABS: 25.0000 mg | ORAL_TABLET | Freq: Four times a day (QID) | ORAL | 2 refills | Status: DC | PRN

## 2024-05-22 NOTE — Telephone Encounter (Signed)
 Pt called and wanted to know if she should come in; yesterday she had diarrhea all day and wasn't able to eat and she also, had some stomach pain. I told her that I would get a nurse to give her a call back.

## 2024-05-22 NOTE — Telephone Encounter (Signed)
 Patient reports that yesterday she had diarrhea all day which resulted in some lower abdominal and lower back pain. She reports no more diarrhea today but some nausea and headache. She reports that she has not taken anything to help with this. RN instructed patient to take Tylenol  for headache and would send in [facility]  to [facility]. Patient denies any symptoms such as blurry vision, numbness/tingling, bleeding or discharge. Patient instructed to return call to office if symptoms do not improve with medication or other symptoms develop. Patient verbalizes understanding with no further questions or concerns today.   Morna LOISE Quale, RN

## 2024-05-24 ENCOUNTER — Ambulatory Visit (HOSPITAL_BASED_OUTPATIENT_CLINIC_OR_DEPARTMENT_OTHER)
Admission: RE | Admit: 2024-05-24 | Discharge: 2024-05-24 | Disposition: A | Discharge status: Home / Self Care | Source: Ambulatory Visit | Attending: Obstetrics & Gynecology | Admitting: Obstetrics & Gynecology

## 2024-05-24 DIAGNOSIS — O3680X0 Pregnancy with inconclusive fetal viability, not applicable or unspecified: Secondary | ICD-10-CM | POA: Diagnosis present

## 2024-05-30 ENCOUNTER — Other Ambulatory Visit (HOSPITAL_BASED_OUTPATIENT_CLINIC_OR_DEPARTMENT_OTHER): Payer: Self-pay

## 2024-05-30 ENCOUNTER — Ambulatory Visit (HOSPITAL_BASED_OUTPATIENT_CLINIC_OR_DEPARTMENT_OTHER): Payer: Self-pay | Admitting: Obstetrics & Gynecology

## 2024-06-01 ENCOUNTER — Inpatient Hospital Stay (HOSPITAL_COMMUNITY)

## 2024-06-01 ENCOUNTER — Inpatient Hospital Stay (HOSPITAL_COMMUNITY)
Admission: AD | Admit: 2024-06-01 | Discharge: 2024-06-01 | Disposition: A | Discharge status: Home / Self Care | Attending: Obstetrics & Gynecology | Admitting: Obstetrics & Gynecology

## 2024-06-01 DIAGNOSIS — Z603 Acculturation difficulty: Secondary | ICD-10-CM | POA: Diagnosis not present

## 2024-06-01 DIAGNOSIS — Z3A09 9 weeks gestation of pregnancy: Secondary | ICD-10-CM

## 2024-06-01 DIAGNOSIS — N930 Postcoital and contact bleeding: Secondary | ICD-10-CM

## 2024-06-01 DIAGNOSIS — O468X1 Other antepartum hemorrhage, first trimester: Secondary | ICD-10-CM

## 2024-06-01 DIAGNOSIS — Z758 Other problems related to medical facilities and other health care: Secondary | ICD-10-CM

## 2024-06-01 DIAGNOSIS — O209 Hemorrhage in early pregnancy, unspecified: Secondary | ICD-10-CM

## 2024-06-01 DIAGNOSIS — O418X10 Other specified disorders of amniotic fluid and membranes, first trimester, not applicable or unspecified: Secondary | ICD-10-CM | POA: Diagnosis not present

## 2024-06-01 LAB — CBC
HCT: 28.8 % — ABNORMAL LOW (ref 36.0–46.0)
Hemoglobin: 9.4 g/dL — ABNORMAL LOW (ref 12.0–15.0)
MCH: 24 pg — ABNORMAL LOW (ref 26.0–34.0)
MCHC: 32.6 g/dL (ref 30.0–36.0)
MCV: 73.7 fL — ABNORMAL LOW (ref 80.0–100.0)
Platelets: 396 K/uL (ref 150–400)
RBC: 3.91 MIL/uL (ref 3.87–5.11)
RDW: 14.9 % (ref 11.5–15.5)
WBC: 11.5 K/uL — ABNORMAL HIGH (ref 4.0–10.5)
nRBC: 0 % (ref 0.0–0.2)

## 2024-06-01 LAB — COMPREHENSIVE METABOLIC PANEL WITH GFR
ALT: 17 U/L (ref 0–44)
AST: 22 U/L (ref 15–41)
Albumin: 3 g/dL — ABNORMAL LOW (ref 3.5–5.0)
Alkaline Phosphatase: 42 U/L (ref 38–126)
Anion gap: 8 (ref 5–15)
BUN: 9 mg/dL (ref 6–20)
CO2: 27 mmol/L (ref 22–32)
Calcium: 8.7 mg/dL — ABNORMAL LOW (ref 8.9–10.3)
Chloride: 102 mmol/L (ref 98–111)
Creatinine, Ser: 0.63 mg/dL (ref 0.44–1.00)
GFR, Estimated: >60 mL/min (ref >60)
Glucose, Bld: 115 mg/dL — ABNORMAL HIGH (ref 70–99)
Potassium: 3.8 mmol/L (ref 3.5–5.1)
Sodium: 137 mmol/L (ref 135–145)
Total Bilirubin: <0.2 mg/dL (ref 0.0–1.2)
Total Protein: 6.7 g/dL (ref 6.5–8.1)

## 2024-06-01 MED ORDER — FERRIC MALTOL 30 MG PO CAPS: 1.0000 | ORAL_CAPSULE | Freq: Two times a day (BID) | ORAL | 2 refills | Status: DC

## 2024-06-01 NOTE — MAU Note (Addendum)
 Sierra Gamble is a 38 y.o. at [redacted]w[redacted]d here in MAU reporting:   930pm noticed bright red bleeding, no blood clots, so far has only changed her pad once but feels more blood coming out as we speak.   Intercourse this morning.  No pain.    Vitals:   06/01/24 2157  BP: 138/71  Pulse: 62  Resp: 15  Temp: 98.1 F (36.7 C)  SpO2: 100%

## 2024-06-01 NOTE — MAU Provider Note (Signed)
 Event Date/Time   First Provider Initiated Contact with Patient 06/01/24 2206      S/HPI [name] is a 38 y.o. H2E6885 patient who presents to MAU today with complaint of vaginal bleeding that started approximately 1/2-hour ago.  Patient states it was bright red.  She denies any blood clots and reports LHP changing her pad once.  Patient does reports she did have sexual relations early this morning.  Denies any pain at this time  The ROS is otherwise negative unless noted in HPI above   O BP 138/71 (BP Location: Right Arm)   Pulse 62   Temp 98.1 F (36.7 C) (Oral)   Resp 15   Ht 5' (1.524 m)   Wt 64.4 kg   LMP 03/20/2024 (Exact Date)   SpO2 100%   BMI 27.71 kg/m  Physical Exam Vitals and nursing note reviewed. Exam conducted with a chaperone present.  Constitutional:      General: She is not in acute distress.    Appearance: Normal appearance. She is not ill-appearing.  HENT:     Head: Normocephalic.     Nose: Nose normal.  Cardiovascular:     Rate and Rhythm: Normal rate.  Pulmonary:     Effort: Pulmonary effort is normal.  Abdominal:     Palpations: Abdomen is soft.     Tenderness: There is no abdominal tenderness.  Musculoskeletal:        General: Normal range of motion.     Cervical back: Normal range of motion.  Skin:    General: Skin is warm.  Neurological:     Mental Status: She is oriented to person, place, and time.  Psychiatric:        Mood and Affect: Mood normal.        Behavior: Behavior normal.    Pelvic: Exam chaperoned by Oleh Loges RN SSE: Pooling of dark red blood in vaginal vault ~ 25 cc removed with proct swabs No clots removed, Cervical OS is visually closed but friable and bleeds to touch   MDM  HIGH  Vaginal bleeding in early pregnacy CBC: Hgb 9.4 ( c/w anemia  RX for iron  at discharge) ABO O POS OB Ultrasound  ( SIUP , +YS, FP, and cardiac activity, Small SCH noted)  Vaginal Swabs deferred due to active VB  UA: Deferred  d/t active VB   Differential diagnosis considered for 1st trimester vaginal bleeding includes but is not limited to: ectopic pregnancy, complete spontaneous abortion, incomplete abortion, missed abortion, threatened abortion, embryonic/fetal demise, cervical insufficiency, cervical or vaginal disorder    Orders Placed This Encounter  Procedures   US  OB Comp Less 14 Wks    Standing Status:   Standing    Number of Occurrences:   1    Symptom/Reason for Exam:   Vaginal bleeding affecting early pregnancy [[id number]]   CBC    Standing Status:   Standing    Number of Occurrences:   1   Comprehensive metabolic panel    Standing Status:   Standing    Number of Occurrences:   1   Maintain IV access    Standing Status:   Standing    Number of Occurrences:   1      Results for orders placed or performed during the hospital encounter of 06/01/24 (from the past 24 hours)  CBC     Status: Abnormal   Collection Time: 06/01/24 10:27 PM  Result Value Ref Range   WBC 11.5 (H)  4.0 - 10.5 K/uL   RBC 3.91 3.87 - 5.11 MIL/uL   Hemoglobin 9.4 (L) 12.0 - 15.0 g/dL   HCT 71.1 (L) 63.9 - 53.9 %   MCV 73.7 (L) 80.0 - 100.0 fL   MCH 24.0 (L) 26.0 - 34.0 pg   MCHC 32.6 30.0 - 36.0 g/dL   RDW 85.0 88.4 - 84.4 %   Platelets 396 150 - 400 K/uL   nRBC 0.0 0.0 - 0.2 %      Study Result  Narrative & Impression  CLINICAL DATA:  [id number] Vaginal bleeding affecting early pregnancy [id number]   EXAM: OBSTETRIC <14 WK ULTRASOUND   TECHNIQUE: Transabdominal ultrasound was performed for evaluation of the gestation as well as the maternal uterus and adnexal regions.   COMPARISON:  May 24, 2024   FINDINGS: Intrauterine gestational sac: Single   Yolk sac:  Present   Fetal Pole:  Present   Cardiac Activity: Present   Heart Rate: 173 bpm   CRL: 30 mm 9w 6d                US  EDC: 12/29/2024   Subchorionic hemorrhage: Small subchronic hematoma measuring 2.3 x 1.9 x 0.8 cm.   Maternal  uterus/adnexae: Likely corpus luteum in the right ovary. Nonenlarged left ovary. No free pelvic fluid.   IMPRESSION: 1. Single, live intrauterine gestation with an estimated gestational age of [redacted] weeks and 6 days. Fetal heart rate of 173 beats per minute. 2. Small subchronic hematoma measuring 2.3 x 1.9 x 0.8 cm. Continued close clinical and sonographic follow-up is recommended.     Electronically Signed   By: Rogelia Myers M.D.   On: 06/01/2024 22:52     I have reviewed the patient chart and performed the physical exam . I have ordered & interpreted the lab results and reviewed and interpreted the OB ultrasound images indepently  and agree with the radiologist findings Medications ordered as stated below.  A/P as described below.  Counseling and education provided and patient agreeable  with plan as described below. Verbalized understanding.    ASSESSMENT Medical screening exam complete  PCB (post coital bleeding)  Subchorionic hematoma in first trimester, single or unspecified fetus  Vaginal bleeding affecting early pregnancy  [redacted] weeks gestation of pregnancy  Language barrier     PLAN Future Appointments  Date Time Provider Department Center  06/15/2024  3:15 PM Delores Nidia CROME, FNP [facility] [phone] [facility]    Discharge from MAU in stable condition  See AVS for full description of educational information and instructions provided to the patient at time of discharge   Warning signs for worsening condition that would warrant emergency follow-up discussed  Patient may return to MAU as needed   Littie Olam LABOR, NP 06/01/2024 11:05 PM   This chart was dictated using voice recognition software, Dragon. Despite the best efforts of this provider to proofread and correct errors, errors may still occur which can change documentation meaning.

## 2024-06-15 ENCOUNTER — Other Ambulatory Visit (HOSPITAL_COMMUNITY)
Admission: RE | Admit: 2024-06-15 | Discharge: 2024-06-15 | Disposition: A | Source: Ambulatory Visit | Attending: Obstetrics & Gynecology | Admitting: Obstetrics & Gynecology

## 2024-06-15 ENCOUNTER — Encounter (HOSPITAL_BASED_OUTPATIENT_CLINIC_OR_DEPARTMENT_OTHER): Admitting: Obstetrics and Gynecology

## 2024-06-15 ENCOUNTER — Encounter (HOSPITAL_BASED_OUTPATIENT_CLINIC_OR_DEPARTMENT_OTHER): Payer: Self-pay | Admitting: Obstetrics & Gynecology

## 2024-06-15 ENCOUNTER — Ambulatory Visit (HOSPITAL_BASED_OUTPATIENT_CLINIC_OR_DEPARTMENT_OTHER): Admitting: Obstetrics & Gynecology

## 2024-06-15 VITALS — BP 146/77 | HR 59 | Wt 139.2 lb

## 2024-06-15 DIAGNOSIS — D649 Anemia, unspecified: Secondary | ICD-10-CM

## 2024-06-15 DIAGNOSIS — O09521 Supervision of elderly multigravida, first trimester: Secondary | ICD-10-CM

## 2024-06-15 DIAGNOSIS — Z3A11 11 weeks gestation of pregnancy: Secondary | ICD-10-CM

## 2024-06-15 DIAGNOSIS — O0991 Supervision of high risk pregnancy, unspecified, first trimester: Secondary | ICD-10-CM | POA: Diagnosis present

## 2024-06-15 DIAGNOSIS — O10919 Unspecified pre-existing hypertension complicating pregnancy, unspecified trimester: Secondary | ICD-10-CM

## 2024-06-15 DIAGNOSIS — O10011 Pre-existing essential hypertension complicating pregnancy, first trimester: Secondary | ICD-10-CM | POA: Diagnosis not present

## 2024-06-15 DIAGNOSIS — Z3402 Encounter for supervision of normal first pregnancy, second trimester: Secondary | ICD-10-CM | POA: Insufficient documentation

## 2024-06-15 DIAGNOSIS — O99011 Anemia complicating pregnancy, first trimester: Secondary | ICD-10-CM | POA: Diagnosis not present

## 2024-06-15 DIAGNOSIS — Z641 Problems related to multiparity: Secondary | ICD-10-CM | POA: Insufficient documentation

## 2024-06-15 DIAGNOSIS — D582 Other hemoglobinopathies: Secondary | ICD-10-CM | POA: Insufficient documentation

## 2024-06-15 LAB — IRON,TIBC AND FERRITIN PANEL
Ferritin: 78 ng/mL (ref 15–150)
Iron Saturation: 30 % (ref 15–55)
Iron: 117 ug/dL (ref 27–159)
Total Iron Binding Capacity: 393 ug/dL (ref 250–450)
UIBC: 276 ug/dL (ref 131–425)

## 2024-06-15 MED ORDER — LABETALOL HCL 100 MG PO TABS: 100.0000 mg | ORAL_TABLET | Freq: Two times a day (BID) | ORAL | Status: DC

## 2024-06-15 NOTE — Progress Notes (Signed)
 History:   Sierra Gamble is a 38 y.o. H2E6875 at [redacted]w[redacted]d by early ultrasound being seen today for her first obstetrical visit.  Her obstetrical history is significant for advanced maternal age and chronic hypertension and hx of gestational diabetes with last two pregnancies. Patient does intend to breast feed. Would like to breast and bottle feed.  Pregnancy history fully reviewed.  Patient reports bleeding about two weeks ago.  She did go to the MAU and ultrasound was done.  [name]      HISTORY: OB History  Gravida Para Term Preterm AB Living  7 4 3 1 2 4   SAB IAB Ectopic Multiple Live Births  2 0 0 0 4    # Outcome Date GA Lbr Len/2nd Weight Sex Type Anes PTL Lv  7 Current           6 SAB 2025             Complications: Anesthetic Complications  5 Term 01/19/23 [redacted]w[redacted]d 01:05 / 00:26 7 lb 1.2 oz (3.21 kg) F Vag-Spont None  LIV     Name: Sierra Gamble     Apgar1: 9  Apgar5: 9  4 Term 06/17/19 [redacted]w[redacted]d 05:21 / 00:13 6 lb 11.8 oz (3.056 kg) F Vag-Spont None  LIV     Name: Sierra Gamble     Apgar1: 8  Apgar5: 9  3 Preterm 12/30/17 [redacted]w[redacted]d 10:50 / 00:22 5 lb 12.9 oz (2.634 kg) M Vag-Spont None  LIV     Name: Sierra Gamble     Apgar1: 9  Apgar5: 9  2 SAB 2010          1 Term 03/16/05 [redacted]w[redacted]d   F Vag-Spont   LIV    Last pap smear was done 07/2022 and was normal  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   COVID-19 06/17/2019   Gestational diabetes    Maternal varicella, non-immune 12/30/2017   Poor fetal growth affecting management of mother in third trimester 04/06/2019   Postpartum hypertension 01/20/2023   Past Surgical History:  Procedure Laterality Date   NO PAST SURGERIES     Family History  Problem Relation Age of Onset   Thyroid disease Mother    Stroke Mother    Hyperlipidemia Mother    COPD Father    Diabetes Maternal Grandmother    Thyroid disease Maternal Grandmother    Social  History[1] Allergies[2] Medications Ordered Prior to Encounter[3]  Review of Systems Pertinent items noted in HPI and remainder of comprehensive ROS otherwise negative.  Physical Exam:   Vitals:   06/15/24 1124  BP: (!) 146/77  Pulse: (!) 59  Weight: 139 lb 3.2 oz (63.1 kg)     Patient informed that the ultrasound is considered a limited obstetric ultrasound and is not intended to be a complete ultrasound exam.  Patient also informed that the ultrasound is not being completed with the intent of assessing for fetal or placental anomalies or any pelvic abnormalities.  Explained that the purpose of todays ultrasound is to assess for fetal heart rate.  Patient acknowledges the purpose of the exam and the limitations of the study. General: well-developed, well-nourished female in no acute distress  Breasts:  deferrred  Skin: normal coloration and turgor, no rashes  Neurologic: oriented, normal, negative, normal mood  Extremities: normal strength, tone, and muscle mass, ROM of all joints is normal  HEENT PERRLA, extraocular movement intact and  sclera clear, anicteric  Neck supple and no masses  Cardiovascular: regular rate and rhythm  Respiratory:  no respiratory distress, normal breath sounds  Abdomen: soft, non-tender; bowel sounds normal; no masses,  no organomegaly  Pelvic: deferred    Assessment:    Pregnancy: H2E6875 Patient Active Problem List   Diagnosis Date Noted   Supervision of high risk pregnancy in first trimester 06/15/2024   Grand multiparity 06/15/2024   AMA (advanced maternal age) multigravida 35+, first trimester 06/15/2024   Chronic hypertension during pregnancy, antepartum 05/06/2024   Hypothyroidism 05/06/2024   History of preterm delivery 01/04/2019     Plan:  1. Supervision of high risk pregnancy in first trimester (Primary) - on PNV - Cervicovaginal ancillary only - Culture, OB Urine - Hemoglobin A1c - PANORAMA PRENATAL TEST - ABO/Rh; Future -  Antibody screen; Future - CBC; Future - Hepatitis B surface antigen; Future - HIV Antibody (routine testing w rflx); Future - HIV (Save tube for possible reflex); Future - RPR; Future - Rubella screen; Future - Hepatitis C antibody; Future - US  MFM OB DETAIL +14 WK; Future  2. Grand multiparity  3. Chronic hypertension during pregnancy, antepartum - CBC; Future - Comp Met (CMET) - Protein / creatinine ratio, urine - labetalol  (NORMODYNE ) 100 MG tablet; Take 1 tablet (100 mg total) by mouth 2 (two) times daily.  4. AMA (advanced maternal age) multigravida 35+, first trimester  5. Anemia, unspecified type - Iron , TIBC and Ferritin Panel   Initial labs drawn. Continue prenatal vitamins. Problem list reviewed and updated. Genetic Screening discussed, NIPS: ordered. Ultrasound discussed; fetal anatomic survey: ordered. Anticipatory guidance about prenatal visits given including labs, ultrasounds, and testing. Discussed usage of Babyscripts and virtual visits as additional source of managing and completing prenatal visits in midst of coronavirus and pandemic.   Encouraged to complete MyChart Registration for her ability to review results, send requests, and have questions addressed.  The nature of [place] [facility]/Faculty Practice with multiple MDs and Advanced Practice Providers was explained to patient; also emphasized that residents, students are part of our team. Routine obstetric precautions reviewed. Encouraged to seek out care at office or emergency room [facility] MAU preferred) for urgent and/or emergent concerns. Return in about 4 weeks (around 07/13/2024).     Sierra Elvie Pinal, MD, FACOG Obstetrician & Gynecologist, [facility] for [facility], [facility]      [1]  Social History Tobacco Use   Smoking status: Never   Smokeless tobacco: Never  Vaping Use   Vaping status: Never Used  Substance Use Topics    Alcohol use: No   Drug use: No  [2] No Known Allergies [3]  Current Outpatient Medications on File Prior to Visit  Medication Sig Dispense Refill   Blood Pressure Monitoring (BLOOD PRESSURE KIT) DEVI 1 Device by Does not apply route once a week. 1 each 0   Prenatal 27-1 MG TABS Take 1 tablet by mouth daily. 30 tablet 11   ferrous sulfate  325 (65 FE) MG EC tablet Take 1 tablet (325 mg total) by mouth 2 (two) times daily. (Patient not taking: Reported on 05/05/2024) 60 tablet 3   Prenatal Vit-Fe Phos-FA-Omega (VITAFOL  GUMMIES) 3.33-0.333-34.8 MG CHEW CHEW 1 TABLET BY MOUTH EVERY DAY (Patient not taking: Reported on 05/05/2024) 90 tablet 5   promethazine  (PHENERGAN ) 25 MG tablet Take 1 tablet (25 mg total) by mouth every 6 (six) hours as needed for nausea or vomiting. (Patient not taking:  Reported on 06/15/2024) 30 tablet 2   No current facility-administered medications on file prior to visit.

## 2024-06-15 NOTE — Progress Notes (Deleted)
 New OB Intake  I connected with Sierra Gamble  on 06/15/2024 at 10:55 AM EST by {Contact:24193} Video Visit and verified that I am speaking with the correct person using two identifiers. Nurse is located at [facility] and pt is located at ***.  I discussed the limitations, risks, security and privacy concerns of performing an evaluation and management service by telephone and the availability of in person appointments. I also discussed with the patient that there may be a patient responsible charge related to this service. The patient expressed understanding and agreed to proceed.  I explained I am completing New OB Intake today. We discussed EDD of *** based on {EDD:33166}. Pt is H2E6885. I reviewed her allergies, medications and Medical/Surgical/OB history.    Patient Active Problem List   Diagnosis Date Noted   Chronic hypertension during pregnancy, antepartum 05/06/2024   Hypothyroidism 05/06/2024   History of preterm delivery 01/04/2019     Concerns addressed today  Delivery Plans Plans to deliver at [facility]. Discussed the nature of our practice with multiple providers including residents and students as well as female and female providers. Due to the size of the practice, the delivering provider may not be the same as those providing prenatal care.   Patient {Is/is not:9024} interested in water birth.  MyChart/Babyscripts MyChart access verified. I explained pt will have some visits in office and some virtually. Babyscripts instructions given and order placed. Patient verifies receipt of registration text/e-mail. Account successfully created and app downloaded. If patient is a candidate for Optimized scheduling, add to sticky note.   Blood Pressure Cuff/Weight Scale {blood pressure cuff:24241} Explained after first prenatal appt pt will check weekly and document in Babyscripts. Patient {weight scale:28336}.  Anatomy US  Explained first scheduled US  will be around 19 weeks. Anatomy US   scheduled for *** at ***.  Is patient a CenteringPregnancy candidate?  {Accepted:19197::Accepted,Not a Candidate,Declined} Declined due to {Declined:19197::Schedule,Childcare,Group setting,Support person concern,Declined to say,Enrolled in [facility],***} Not a candidate due to {Not a Candidate:19197::DM,CHTN, medication controlled,Language barrier,>28 weeks,Multiple gestation (mono-mono or mono-di),Complex coordination of care needed,***} If accepted,    Is patient a Mom+Baby Combined Care candidate?  {Accepted:19197::Accepted,Declined,Not a candidate,***}   If accepted, confirm patient does not intend to move from the area for at least 12 months, then notify Mom+Baby staff  Is patient a candidate for Babyscripts Optimization? {babyscripts:31704}   First visit review I reviewed new OB appt with patient. Explained pt will be seen by *** at first visit. Discussed [place] genetic screening with patient. *** Panorama and Horizon.. Routine prenatal labs {collected today/needed at new OB visit:9024}   Last Pap Diagnosis  Date Value Ref Range Status  08/21/2022   Final   - Negative for intraepithelial lesion or malignancy (NILM)    Danese Dorsainvil E, RN 06/15/2024  11:22 AM

## 2024-06-15 NOTE — Addendum Note (Signed)
 Addended by: VAN MORNA SAILOR on: 06/15/2024 01:11 PM   Modules accepted: Orders

## 2024-06-16 ENCOUNTER — Ambulatory Visit (HOSPITAL_BASED_OUTPATIENT_CLINIC_OR_DEPARTMENT_OTHER): Payer: Self-pay | Admitting: Obstetrics & Gynecology

## 2024-06-16 DIAGNOSIS — O0991 Supervision of high risk pregnancy, unspecified, first trimester: Secondary | ICD-10-CM

## 2024-06-16 LAB — COMPREHENSIVE METABOLIC PANEL WITH GFR
ALT: 18 IU/L (ref 0–32)
AST: 21 IU/L (ref 0–40)
Albumin: 4.4 g/dL (ref 3.9–4.9)
Alkaline Phosphatase: 55 IU/L (ref 41–116)
BUN/Creatinine Ratio: 15 (ref 9–23)
BUN: 9 mg/dL (ref 6–20)
Bilirubin Total: 0.3 mg/dL (ref 0.0–1.2)
CO2: 22 mmol/L (ref 20–29)
Calcium: 9.6 mg/dL (ref 8.7–10.2)
Chloride: 98 mmol/L (ref 96–106)
Creatinine, Ser: 0.62 mg/dL (ref 0.57–1.00)
Globulin, Total: 3.5 g/dL (ref 1.5–4.5)
Glucose: 64 mg/dL — ABNORMAL LOW (ref 70–99)
Potassium: 3.9 mmol/L (ref 3.5–5.2)
Sodium: 135 mmol/L (ref 134–144)
Total Protein: 7.9 g/dL (ref 6.0–8.5)
eGFR: 116 mL/min/1.73

## 2024-06-16 LAB — CBC
Hematocrit: 32.1 % — ABNORMAL LOW (ref 34.0–46.6)
Hemoglobin: 10.2 g/dL — ABNORMAL LOW (ref 11.1–15.9)
MCH: 24.6 pg — ABNORMAL LOW (ref 26.6–33.0)
MCHC: 31.8 g/dL (ref 31.5–35.7)
MCV: 77 fL — ABNORMAL LOW (ref 79–97)
Platelets: 399 x10E3/uL (ref 150–450)
RBC: 4.15 x10E6/uL (ref 3.77–5.28)
RDW: 14.7 % (ref 11.7–15.4)
WBC: 9.6 x10E3/uL (ref 3.4–10.8)

## 2024-06-16 LAB — ABO/RH: Rh Factor: POSITIVE

## 2024-06-16 LAB — HEMOGLOBIN A1C
Est. average glucose Bld gHb Est-mCnc: 114 mg/dL
Hgb A1c MFr Bld: 5.6 % (ref 4.8–5.6)

## 2024-06-16 LAB — HEPATITIS B SURFACE ANTIGEN: Hepatitis B Surface Ag: NEGATIVE

## 2024-06-16 LAB — HIV ANTIBODY (ROUTINE TESTING W REFLEX): HIV Screen 4th Generation wRfx: NONREACTIVE

## 2024-06-16 LAB — ANTIBODY SCREEN: Antibody Screen: NEGATIVE

## 2024-06-16 LAB — RUBELLA SCREEN: Rubella Antibodies, IGG: 2.42 {index}

## 2024-06-16 LAB — HEPATITIS C ANTIBODY: Hep C Virus Ab: NONREACTIVE

## 2024-06-16 LAB — CERVICOVAGINAL ANCILLARY ONLY
Chlamydia: NEGATIVE
Comment: NEGATIVE
Comment: NORMAL
Neisseria Gonorrhea: NEGATIVE

## 2024-06-16 LAB — SYPHILIS: RPR W/REFLEX TO RPR TITER AND TREPONEMAL ANTIBODIES, TRADITIONAL SCREENING AND DIAGNOSIS ALGORITHM: RPR Ser Ql: NONREACTIVE

## 2024-06-17 LAB — PROTEIN / CREATININE RATIO, URINE
Creatinine, Urine: 181.4 mg/dL
Protein, Ur: 15.1 mg/dL
Protein/Creat Ratio: 83 mg/g{creat} (ref 0–200)

## 2024-06-20 ENCOUNTER — Encounter (HOSPITAL_BASED_OUTPATIENT_CLINIC_OR_DEPARTMENT_OTHER): Admitting: Certified Nurse Midwife

## 2024-06-22 LAB — PANORAMA PRENATAL TEST FULL PANEL:PANORAMA TEST PLUS 5 ADDITIONAL MICRODELETIONS: FETAL FRACTION: 8.6

## 2024-07-11 ENCOUNTER — Encounter (HOSPITAL_BASED_OUTPATIENT_CLINIC_OR_DEPARTMENT_OTHER): Admitting: Obstetrics and Gynecology

## 2024-07-13 ENCOUNTER — Ambulatory Visit (HOSPITAL_BASED_OUTPATIENT_CLINIC_OR_DEPARTMENT_OTHER): Payer: Self-pay | Admitting: Obstetrics & Gynecology

## 2024-07-13 ENCOUNTER — Other Ambulatory Visit (HOSPITAL_BASED_OUTPATIENT_CLINIC_OR_DEPARTMENT_OTHER): Payer: Self-pay | Admitting: Obstetrics & Gynecology

## 2024-07-13 ENCOUNTER — Encounter (HOSPITAL_BASED_OUTPATIENT_CLINIC_OR_DEPARTMENT_OTHER): Payer: Self-pay | Admitting: Obstetrics & Gynecology

## 2024-07-13 VITALS — BP 118/64 | HR 74 | Wt 146.4 lb

## 2024-07-13 DIAGNOSIS — D582 Other hemoglobinopathies: Secondary | ICD-10-CM | POA: Diagnosis not present

## 2024-07-13 DIAGNOSIS — Z3A15 15 weeks gestation of pregnancy: Secondary | ICD-10-CM

## 2024-07-13 DIAGNOSIS — O99012 Anemia complicating pregnancy, second trimester: Secondary | ICD-10-CM | POA: Diagnosis not present

## 2024-07-13 DIAGNOSIS — I10 Essential (primary) hypertension: Secondary | ICD-10-CM

## 2024-07-13 DIAGNOSIS — R7989 Other specified abnormal findings of blood chemistry: Secondary | ICD-10-CM

## 2024-07-13 DIAGNOSIS — O99112 Other diseases of the blood and blood-forming organs and certain disorders involving the immune mechanism complicating pregnancy, second trimester: Secondary | ICD-10-CM

## 2024-07-13 DIAGNOSIS — O99891 Other specified diseases and conditions complicating pregnancy: Secondary | ICD-10-CM

## 2024-07-13 DIAGNOSIS — O09522 Supervision of elderly multigravida, second trimester: Secondary | ICD-10-CM

## 2024-07-13 DIAGNOSIS — D6489 Other specified anemias: Secondary | ICD-10-CM | POA: Diagnosis not present

## 2024-07-13 DIAGNOSIS — O10912 Unspecified pre-existing hypertension complicating pregnancy, second trimester: Secondary | ICD-10-CM

## 2024-07-13 DIAGNOSIS — O09521 Supervision of elderly multigravida, first trimester: Secondary | ICD-10-CM

## 2024-07-13 DIAGNOSIS — Z3402 Encounter for supervision of normal first pregnancy, second trimester: Secondary | ICD-10-CM

## 2024-07-13 MED ORDER — NIFEDIPINE ER OSMOTIC RELEASE 30 MG PO TB24: 30.0000 mg | ORAL_TABLET | Freq: Every day | ORAL | 5 refills | Status: AC

## 2024-07-14 NOTE — Progress Notes (Signed)
 "  PRENATAL VISIT NOTE  Subjective:  Sierra Gamble is a 39 y.o. H2E6875 at [redacted]w[redacted]d being seen today for ongoing prenatal care.  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  Patient reports she has not started her labetolol.  Feels twice daily dosing is very hard and she is busy in the mornings and then forgets.  Once she's missed one she doesn't think she should take the second one.  Daily medication dosing discussed.  Will switch to 30mg  procardia  XL.  Contractions: Not present. Vag. Bleeding: None.  Movement: Absent. Denies leaking of fluid.   The following portions of the patient's history were reviewed and updated as appropriate: allergies, current medications, past family history, past medical history, past social history, past surgical history and problem list.   Objective:   Vitals:   07/13/24 1637  BP: 118/64  Pulse: 74  Weight: 146 lb 6.4 oz (66.4 kg)    Fetal Status:      Movement: Absent    General: Alert, oriented and cooperative. Patient is in no acute distress.  Skin: Skin is warm and dry. No rash noted.   Cardiovascular: Normal heart rate noted  Respiratory: Normal respiratory effort, no problems with respiration noted  Abdomen: Soft, gravid, appropriate for gestational age.  Pain/Pressure: Absent     Pelvic: Cervical exam deferred        Extremities: Normal range of motion.  Edema: None  Mental Status: Normal mood and affect. Normal behavior. Normal judgment and thought content.      06/15/2024   11:40 AM 11/04/2023    3:44 PM 12/09/2022   12:00 PM  Depression screen PHQ 2/9  Decreased Interest 1 0 0   Down, Depressed, Hopeless 0 0 0  PHQ - 2 Score 1 0 0  Altered sleeping  0   Tired, decreased energy  0   Change in appetite  0   Feeling bad or failure about yourself   0   Trouble concentrating  0   Moving slowly or fidgety/restless  0   Suicidal thoughts  0   PHQ-9 Score  0       Data saved with a previous flowsheet row definition        06/15/2024   11:41 AM 12/16/2023   12:24 PM 11/04/2023    3:44 PM 11/03/2022    8:22 AM  GAD 7 : Generalized Anxiety Score  Nervous, Anxious, on Edge 0 0 0 0  Control/stop worrying 0 3 0 1  Worry too much - different things 0 0 0 1  Trouble relaxing 1 0 0 0  Restless 0 0 0 0  Easily annoyed or irritable 1 1 1 1   Afraid - awful might happen 1 0 0 0  Total GAD 7 Score 3 4 1 3   Anxiety Difficulty Not difficult at all       Assessment and Plan:  Pregnancy: H2E6875 at [redacted]w[redacted]d 1. Encounter for supervision of normal first pregnancy in second trimester (Primary) - on PNV  2. [redacted] weeks gestation of pregnancy - recheck 4 weeks - anatomy scan not scheduled.  Will reach out to MFM for scheduling.  3. Chronic hypertension - will switch to daily dosing nifedipine  -  NIFEdipine  (PROCARDIA  XL) 30 MG 24 hr tablet; Take 1 tablet (30 mg total) by mouth daily.  Dispense: 30 tablet; Refill: 5  4. 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 Anemia due to other cause, not classified - normal iron  panels.  Does not need extra iron .  Due to hemoglobin E carrier status.  6. Abnormal TSH - will check TSH and free T4.  Preterm labor symptoms and general obstetric precautions including but not limited to vaginal bleeding, contractions, leaking of fluid and fetal movement were reviewed in detail with the patient. Please refer to After Visit Summary for other counseling recommendations.   No follow-ups on file.  Future Appointments  Date Time Provider Department Center  08/11/2024  8:15 AM Delores Nidia CROME, FNP [id number] [facility]  09/07/2024  3:55 PM Cleotilde Ronal RAMAN, MD [id number] [facility]  10/02/2024  8:30 AM [id number] [facility]  10/02/2024 10:15 AM Lo, Arland POUR, CNM [id number] [facility]  10/31/2024  8:15 AM Lo, Arland POUR, CNM [id number] [facility]  11/14/2024  8:15 AM Lo, Arland POUR, CNM [id number] [facility]  11/28/2024  8:15 AM Lo, Arland POUR, CNM [id number] [facility]  12/15/2024  8:15 AM Lo, Arland POUR, CNM [facility] [phone] [facility]    Ronal RAMAN Cleotilde, MD  "

## 2024-07-17 ENCOUNTER — Telehealth (HOSPITAL_BASED_OUTPATIENT_CLINIC_OR_DEPARTMENT_OTHER): Payer: Self-pay

## 2024-07-17 ENCOUNTER — Other Ambulatory Visit (HOSPITAL_BASED_OUTPATIENT_CLINIC_OR_DEPARTMENT_OTHER): Payer: Self-pay

## 2024-07-17 DIAGNOSIS — R7989 Other specified abnormal findings of blood chemistry: Secondary | ICD-10-CM

## 2024-07-17 DIAGNOSIS — Z3402 Encounter for supervision of normal first pregnancy, second trimester: Secondary | ICD-10-CM

## 2024-07-17 NOTE — Telephone Encounter (Addendum)
 Spoke with patient. Patient states that she began taking her antihypertensive 2 days ago. Patient is agreeable to having labs done prior to her next appointment and would like to have AFP done. Lab appointment scheduled for 1/22 at 3:45 pm. Patient is agreeable to date and time. Future lab orders placed.

## 2024-07-17 NOTE — Telephone Encounter (Signed)
-----   Message from Ronal Pinal, MD sent at 07/14/2024  4:08 PM EST ----- Can you call this pt and see if she's started her antihypertensive.  She was changed to daily dosing as she thought the twice daily dosing was too hard to take.  Also, I saw she had an abnormal TSH in the fall.  I'd like her to have thyroid testing before the next appt.  TSH and free T4.  Could do AFP as well.  Can you call and see if she is willing to have this done prior to next appt.  If not, I will need to order for next appt.  Thanks.  MSM

## 2024-07-20 ENCOUNTER — Other Ambulatory Visit (HOSPITAL_BASED_OUTPATIENT_CLINIC_OR_DEPARTMENT_OTHER): Payer: Self-pay

## 2024-07-21 LAB — AFP, SERUM, OPEN SPINA BIFIDA
AFP MoM: 0.98
AFP Value: 34.3 ng/mL
Gest. Age on Collection Date: 16.4 wk
Maternal Age At EDD: 39.5 a
OSBR Risk 1 IN: 10000
Test Results:: NEGATIVE
Weight: 147 [lb_av]

## 2024-07-21 LAB — T4, FREE: Free T4: 0.89 ng/dL (ref 0.82–1.77)

## 2024-07-21 LAB — TSH: TSH: 2.62 u[IU]/mL (ref 0.450–4.500)

## 2024-07-25 ENCOUNTER — Ambulatory Visit (HOSPITAL_BASED_OUTPATIENT_CLINIC_OR_DEPARTMENT_OTHER): Payer: Self-pay | Admitting: Obstetrics & Gynecology

## 2024-08-11 ENCOUNTER — Encounter (HOSPITAL_BASED_OUTPATIENT_CLINIC_OR_DEPARTMENT_OTHER): Payer: Self-pay | Admitting: Certified Nurse Midwife

## 2024-08-18 ENCOUNTER — Other Ambulatory Visit

## 2024-08-18 ENCOUNTER — Ambulatory Visit

## 2024-08-21 ENCOUNTER — Ambulatory Visit

## 2024-08-21 ENCOUNTER — Other Ambulatory Visit

## 2024-09-07 ENCOUNTER — Encounter (HOSPITAL_BASED_OUTPATIENT_CLINIC_OR_DEPARTMENT_OTHER): Payer: Self-pay | Admitting: Obstetrics & Gynecology

## 2024-10-02 ENCOUNTER — Encounter (HOSPITAL_BASED_OUTPATIENT_CLINIC_OR_DEPARTMENT_OTHER): Payer: Self-pay | Admitting: Obstetrics & Gynecology

## 2024-10-02 ENCOUNTER — Other Ambulatory Visit (HOSPITAL_BASED_OUTPATIENT_CLINIC_OR_DEPARTMENT_OTHER): Payer: Self-pay

## 2024-10-31 ENCOUNTER — Encounter (HOSPITAL_BASED_OUTPATIENT_CLINIC_OR_DEPARTMENT_OTHER): Payer: Self-pay | Admitting: Certified Nurse Midwife

## 2024-11-14 ENCOUNTER — Encounter (HOSPITAL_BASED_OUTPATIENT_CLINIC_OR_DEPARTMENT_OTHER): Payer: Self-pay | Admitting: Certified Nurse Midwife

## 2024-11-28 ENCOUNTER — Encounter (HOSPITAL_BASED_OUTPATIENT_CLINIC_OR_DEPARTMENT_OTHER): Payer: Self-pay | Admitting: Certified Nurse Midwife

## 2024-12-15 ENCOUNTER — Encounter (HOSPITAL_BASED_OUTPATIENT_CLINIC_OR_DEPARTMENT_OTHER): Payer: Self-pay | Admitting: Certified Nurse Midwife
